# Patient Record
Sex: Female | Born: 1960
Health system: Southern US, Community
[De-identification: ages and names within clinical notes are randomized; demographics above are authoritative.]

## PROBLEM LIST (undated history)

## (undated) DIAGNOSIS — M1990 Unspecified osteoarthritis, unspecified site: Secondary | ICD-10-CM

## (undated) DIAGNOSIS — R87629 Unspecified abnormal cytological findings in specimens from vagina: Secondary | ICD-10-CM

## (undated) DIAGNOSIS — K589 Irritable bowel syndrome without diarrhea: Secondary | ICD-10-CM

## (undated) DIAGNOSIS — D649 Anemia, unspecified: Secondary | ICD-10-CM

## (undated) DIAGNOSIS — K219 Gastro-esophageal reflux disease without esophagitis: Secondary | ICD-10-CM

## (undated) DIAGNOSIS — E785 Hyperlipidemia, unspecified: Secondary | ICD-10-CM

## (undated) HISTORY — PX: TONSILLECTOMY: SUR1361

## (undated) HISTORY — DX: Hyperlipidemia, unspecified: E78.5

## (undated) HISTORY — DX: Irritable bowel syndrome without diarrhea: K58.9

## (undated) HISTORY — DX: Unspecified osteoarthritis, unspecified site: M19.90

## (undated) HISTORY — PX: COLPOSCOPY: SHX161

## (undated) HISTORY — PX: NISSEN FUNDOPLICATION: SHX2091

## (undated) HISTORY — DX: Unspecified abnormal cytological findings in specimens from vagina: R87.629

## (undated) HISTORY — DX: Anemia, unspecified: D64.9

## (undated) HISTORY — DX: Gastro-esophageal reflux disease without esophagitis: K21.9

## (undated) HISTORY — PX: TUBAL LIGATION: SHX77

---

## 2006-10-13 HISTORY — PX: BUNIONECTOMY: SHX129

## 2013-11-28 ENCOUNTER — Other Ambulatory Visit (HOSPITAL_COMMUNITY)
Admission: RE | Admit: 2013-11-28 | Discharge: 2013-11-28 | Disposition: A | Discharge status: Home / Self Care | Payer: 59 | Source: Ambulatory Visit | Attending: Nurse Practitioner | Admitting: Nurse Practitioner

## 2013-11-28 DIAGNOSIS — Z124 Encounter for screening for malignant neoplasm of cervix: Secondary | ICD-10-CM | POA: Insufficient documentation

## 2013-11-28 DIAGNOSIS — R87810 Cervical high risk human papillomavirus (HPV) DNA test positive: Secondary | ICD-10-CM | POA: Insufficient documentation

## 2013-11-28 DIAGNOSIS — Z1151 Encounter for screening for human papillomavirus (HPV): Secondary | ICD-10-CM | POA: Insufficient documentation

## 2015-01-16 ENCOUNTER — Other Ambulatory Visit (HOSPITAL_COMMUNITY)
Admission: RE | Admit: 2015-01-16 | Discharge: 2015-01-16 | Disposition: A | Discharge status: Home / Self Care | Payer: 59 | Source: Ambulatory Visit | Attending: Nurse Practitioner | Admitting: Nurse Practitioner

## 2015-01-16 ENCOUNTER — Other Ambulatory Visit: Payer: Self-pay | Admitting: Nurse Practitioner

## 2015-01-16 DIAGNOSIS — R87810 Cervical high risk human papillomavirus (HPV) DNA test positive: Secondary | ICD-10-CM | POA: Insufficient documentation

## 2015-01-16 DIAGNOSIS — Z1151 Encounter for screening for human papillomavirus (HPV): Secondary | ICD-10-CM | POA: Insufficient documentation

## 2015-01-16 DIAGNOSIS — Z01411 Encounter for gynecological examination (general) (routine) with abnormal findings: Secondary | ICD-10-CM | POA: Insufficient documentation

## 2015-01-17 LAB — CYTOLOGY - PAP

## 2015-03-13 ENCOUNTER — Other Ambulatory Visit: Payer: Self-pay | Admitting: Nurse Practitioner

## 2015-03-14 ENCOUNTER — Encounter: Payer: Self-pay | Admitting: Physical Therapy

## 2015-03-14 ENCOUNTER — Ambulatory Visit: Payer: 59 | Attending: Family Medicine | Admitting: Physical Therapy

## 2015-03-14 DIAGNOSIS — M25551 Pain in right hip: Secondary | ICD-10-CM | POA: Diagnosis not present

## 2015-03-14 DIAGNOSIS — M545 Low back pain, unspecified: Secondary | ICD-10-CM

## 2015-03-14 DIAGNOSIS — M25552 Pain in left hip: Secondary | ICD-10-CM | POA: Diagnosis not present

## 2015-03-14 DIAGNOSIS — M25651 Stiffness of right hip, not elsewhere classified: Secondary | ICD-10-CM | POA: Diagnosis not present

## 2015-03-14 DIAGNOSIS — M25652 Stiffness of left hip, not elsewhere classified: Secondary | ICD-10-CM | POA: Diagnosis not present

## 2015-03-14 NOTE — Therapy (Signed)
[facility] [address], [place], [place], [address] Phone: [phone]   Fax:  [phone]  Physical Therapy Evaluation  Patient Details  Name: Julie Burton MRN: [id number] Date of Birth: Jan 30, 1961 Referring Provider:  Kathyrn Lass, MD  Encounter Date: 03/14/2015      PT End of Session - 03/14/15 1015    Visit Number 1   Date for PT Re-Evaluation 04/25/15   PT Start Time 0932   PT Stop Time 6812   PT Time Calculation (min) 43 min   Activity Tolerance Patient tolerated treatment well   Behavior During Therapy [facility] for tasks assessed/performed      Past Medical History  Diagnosis Date  . Arthritis   . GERD (gastroesophageal reflux disease)     Past Surgical History  Procedure Laterality Date  . Bunionectomy  2008    There were no vitals filed for this visit.  Visit Diagnosis:  Bilateral low back pain without sciatica - Plan: PT plan of care cert/re-cert, CANCELED: PT plan of care cert/re-cert  Bilateral hip pain - Plan: PT plan of care cert/re-cert, CANCELED: PT plan of care cert/re-cert  Stiffness of hip joint, left - Plan: PT plan of care cert/re-cert, CANCELED: PT plan of care cert/re-cert  Stiffness of hip joint, right - Plan: PT plan of care cert/re-cert, CANCELED: PT plan of care cert/re-cert      Subjective Assessment - 03/14/15 0939    Subjective In mid forties, patient began experiencing pain in her low back (spasms). Now the back hurts all the time. Her hips hurt if she is up on her feet a lot.  She is a full time nurse. She has cut back her physical activity significantly outside of work due to pain.    Pertinent History may have fibromyalgia, but couldn't tolerae the meds for it.; family h/o of DJD and back surgeries   Diagnostic tests xrays 2014 didnt show any bony problems in back or hips.   Patient Stated Goals get my core strong enough, decrease pain, get back to walking and hiking and  yard work   Currently in Pain? Yes   Pain Score 3    Pain Location Back   Pain Orientation Lower;Right;Left   Pain Descriptors / Indicators Aching   Pain Type Chronic pain   Pain Radiating Towards bil hips   Pain Onset More than a month ago   Pain Frequency Constant   Aggravating Factors  driving more than an hour   Pain Relieving Factors heat, lying down   Effect of Pain on Daily Activities limitnig gardening, walking    Multiple Pain Sites Yes   Pain Score 3   Pain Location Hip   Pain Orientation Left   Pain Descriptors / Indicators Sharp;Burning   Pain Type Chronic pain   Pain Onset More than a month ago   Pain Frequency Constant   Aggravating Factors  sleeping   Pain Relieving Factors heat voltarin prn   Effect of Pain on Daily Activities limiting walking, gardening            [facility] PT Assessment - 03/14/15 0001    Assessment   Medical Diagnosis LBP; left hip pain   Next MD Visit not scheduled   Prior Therapy none   Precautions   Precautions None   Restrictions   Weight Bearing Restrictions No   Balance Screen   Has the patient fallen in the past 6 months No   Has the patient had  a decrease in activity level because of a fear of falling?  No   Is the patient reluctant to leave their home because of a fear of falling?  No   Prior Function   Level of Independence Independent   Vocation Full time employment   Vocation Requirements nurse   Observation/Other Assessments   Focus on Therapeutic Outcomes (FOTO)  39% limited   ROM / Strength   AROM / PROM / Strength AROM;Strength   AROM   AROM Assessment Site Lumbar   Strength   Strength Assessment Site Hip;Knee   Right/Left Hip Right;Left   Right Hip External Rotation  4   Right Hip Internal Rotation  5   Right Hip ABduction 4+/5   Left Hip Flexion 4+/5   Left Hip External Rotation  4   Left Hip Internal Rotation  4   Right/Left Knee Right;Left   Flexibility   Soft Tissue Assessment /Muscle Length --   tight bil HS, right HF, piriformis, bil ITB   Palpation   Spinal mobility Left L2/3 decreased with mild pain.   Palpation comment right psoas tender   Special Tests    Special Tests Hip Special Tests   Hip Special Tests  Saralyn Pilar (FABER) Test;Ober's Test;SI Distraction;Hip Scouring   Saralyn Pilar (FABER) Test   Findings Positive   Side Right   Ober's Test   Findings Positive   Side Right;Left   SI Distraction   Findings  Negative   Side Right;Left   Hip Scouring   Findings Positive   Side Right                   [facility] Adult PT Treatment/Exercise - 03/14/15 0001    Exercises   Exercises Knee/Hip   Knee/Hip Exercises: Stretches   Active Hamstring Stretch 1 rep;30 seconds   Hip Flexor Stretch 1 rep;30 seconds  kneeling   ITB Stretch 1 rep;30 seconds  with strap   Piriformis Stretch 1 rep;30 seconds  figure 4                PT Education - 03/14/15 1528    Education provided Yes   Education Details HEP ITB, HS, HF and piriformis stretches   Person(s) Educated Patient   Methods Explanation;Demonstration;Handout   Comprehension Verbalized understanding;Returned demonstration          PT Short Term Goals - 03/14/15 1728    PT SHORT TERM GOAL #1   Title I with initial HEP   Time 2   Period Weeks   Status New           PT Long Term Goals - 03/14/15 1729    PT LONG TERM GOAL #1   Title I with advanced Hep   Time 6   Period Weeks   Status New   PT LONG TERM GOAL #2   Title demo improved hip flexibility by 50% or more   Time 6   Period Weeks   Status New   PT LONG TERM GOAL #3   Title report decreased pain in the low back and hips by 50%   Time 6   Period Weeks   Status New   PT LONG TERM GOAL #4   Title report increased activity tolerance by 50%   Time 6   Period Weeks   Status New   PT LONG TERM GOAL #5   Title improved FOTO to 32%   Time 6   Period Weeks   Status New  Plan - 03/14/15 1531    Clinical  Impression Statement 54 year old female with chronic back and hip pain for three years plus. 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 She wold like to return to these activities. She has bil hip weakness and hip tightness and decreased spinal mobility. Hip tightness likely contributing to LB and hip pain.   Pt will benefit from skilled therapeutic intervention in order to improve on the following deficits Decreased range of motion;Increased muscle spasms;Decreased activity tolerance;Pain;Hypomobility;Impaired flexibility;Decreased strength   Rehab Potential Excellent   PT Frequency 2x / week   PT Duration 6 weeks   PT Treatment/Interventions ADLs/Self Care Home Management;Moist Heat;Traction;Therapeutic exercise;Taping;Manual techniques;Cryotherapy;Neuromuscular re-education;Electrical Stimulation;Patient/family education;Passive range of motion   PT Next Visit Plan Review stretches, core strength, functional strengthening   Consulted and Agree with Plan of Care Patient         Problem List There are no active problems to display for this patient.  Almyra Free Rony Ratz PT  03/14/2015, 5:45 PM  [facility] [address] [place], [place], [address] Phone: [phone]   Fax:  [phone]

## 2015-03-14 NOTE — Patient Instructions (Signed)
  Hamstring Stretch, Reclined (Strap, Doorframe)   Lengthen bottom leg on floor. Extend top leg along edge of doorframe or press foot up into yoga strap. Hold for 30____ breaths. Repeat 3____ times each leg.  Piriformis (Supine)  Cross legs, right on top. Gently pull other knee toward chest until stretch is felt in buttock/hip of top leg. Hold ____ seconds. Repeat ____ times per set. Do ____ sets per session. Do ____ sessions per day.    Outer Hip Stretch: Reclined IT Band Stretch (Strap)   Strap around opposite foot, pull across only as far as possible with shoulders on mat. Hold for __30__ secondss. Repeat __3__ times each leg.  Copyright  VHI. All rights reserved.   Flexors, Kneeling   Kneel on one leg. Slowly push pelvis down while slightly arching back until stretch is felt on front of hip. Hold _30__ seconds.  Repeat 3_ times per session. Do _2__ sessions per day.  Copyright  VHI. All rights reserved.   Madelyn Flavors, PT 03/14/2015 10:14 AM [facility] [address] [place], [place] [address] Phone # [phone] Fax [phone]

## 2015-03-16 ENCOUNTER — Ambulatory Visit: Payer: 59 | Admitting: Physical Therapy

## 2015-03-16 DIAGNOSIS — M25651 Stiffness of right hip, not elsewhere classified: Secondary | ICD-10-CM

## 2015-03-16 DIAGNOSIS — M545 Low back pain, unspecified: Secondary | ICD-10-CM

## 2015-03-16 DIAGNOSIS — M25551 Pain in right hip: Secondary | ICD-10-CM

## 2015-03-16 DIAGNOSIS — M25552 Pain in left hip: Secondary | ICD-10-CM

## 2015-03-16 DIAGNOSIS — M25652 Stiffness of left hip, not elsewhere classified: Secondary | ICD-10-CM

## 2015-03-16 NOTE — Therapy (Signed)
[facility] [address] [place], [place], [address] Phone: [phone]   Fax:  [phone]  Physical Therapy Treatment  Patient Details  Name: Julie Burton MRN: [id number] Date of Birth: [birth year] Referring Provider:  Kathyrn Lass, MD  Encounter Date: 03/16/2015      PT End of Session - 03/16/15 0932    Visit Number 2   Date for PT Re-Evaluation 04/25/15   PT Start Time 0931   PT Stop Time 1012   PT Time Calculation (min) 41 min   Activity Tolerance Patient tolerated treatment well      Past Medical History  Diagnosis Date  . Arthritis   . GERD (gastroesophageal reflux disease)     Past Surgical History  Procedure Laterality Date  . Bunionectomy  2008    There were no vitals filed for this visit.  Visit Diagnosis:  Bilateral low back pain without sciatica  Bilateral hip pain  Stiffness of hip joint, left  Stiffness of hip joint, right      Subjective Assessment - 03/16/15 0932    Subjective I'm at baseline today. Did some stretches this morning.   Currently in Pain? Yes   Pain Score 2    Pain Location Back   Pain Orientation Right;Left   Multiple Pain Sites Yes   Pain Score 3   Pain Location Hip   Pain Orientation Left   Pain Descriptors / Indicators Burning                         [facility] Adult PT Treatment/Exercise - 03/16/15 0001    Exercises   Exercises Lumbar   Lumbar Exercises: Stretches   Active Hamstring Stretch 1 rep;30 seconds   ITB Stretch 1 rep;60 seconds  with strap; bil   Piriformis Stretch 1 rep;30 seconds  figure 4, bil   Lumbar Exercises: Machines for Strengthening   Elliptical L1 Incline 7 x 5 min   Lumbar Exercises: Supine   Ab Set 5 reps;5 seconds   Lumbar Exercises: Quadruped   Plank 2 x max hold on elbows; Side plank 1 rep each side   Knee/Hip Exercises: Stretches   Hip Flexor Stretch 1 rep;30 seconds  kneeling, bil added rot too   Knee/Hip Exercises: Sidelying   Clams 5-30 sec hold; then 10 sec holds with green band x                 PT Education - 03/16/15 1021    Education provided Yes   Education Details TrAb set, plank, side plank, clams   Person(s) Educated Patient   Methods Explanation;Demonstration;Handout   Comprehension Verbalized understanding;Returned demonstration          PT Short Term Goals - 03/14/15 1728    PT SHORT TERM GOAL #1   Title I with initial HEP   Time 2   Period Weeks   Status New           PT Long Term Goals - 03/14/15 1729    PT LONG TERM GOAL #1   Title I with advanced Hep   Time 6   Period Weeks   Status New   PT LONG TERM GOAL #2   Title demo improved hip flexibility by 50% or more   Time 6   Period Weeks   Status New   PT LONG TERM GOAL #3   Title report decreased pain in the low back and hips by 50%  Time 6   Period Weeks   Status New   PT LONG TERM GOAL #4   Title report increased activity tolerance by 50%   Time 6   Period Weeks   Status New   PT LONG TERM GOAL #5   Title improved FOTO to 32%   Time 6   Period Weeks   Status New               Plan - 03/16/15 1021    Clinical Impression Statement Paitient at baseline pain today. tolerated core  and hip strenghtening without increased pain.   PT Next Visit Plan continue core and LE strength        Problem List There are no active problems to display for this patient.   Almyra Free Camdon Saetern PT  03/16/2015, 10:23 AM  [facility] [address] [place], [place], [address] Phone: [phone]   Fax:  [phone]

## 2015-03-16 NOTE — Patient Instructions (Signed)
Clam Shell 45 Degrees   Lying with hips and knees bent 45. Place band around thighs. Lift knee. Be sure pelvis does not roll backward. Do not arch back. Hold 10 seconds. Do _10-30__ times, each leg, _1__ times per day.  http://ss.exer.us/75   Copyright  VHI. All rights reserved.   Plank   Support body on hands and feet. Keep hips in line with torso and arms straight under chest. Avoid locking elbows. Hold for 30 seconds. ADVANCED: Extend one leg up.  Copyright  VHI. All rights reserved.   Side Plank  Modification: Do side plank on elbow. From side, press up on one arm and side of foot. Extend other arm up. Hold for   15-30  seconds. Repeat on other side.   Copyright  VHI. All rights reserved.   Lower abdominal/core stability exercises  1. Practice your breathing technique: Inhale through your nose expanding your belly and rib cage. Try not to breathe into your chest. Exhale slowly and gradually out your mouth feeling a sense of softness to your body. Practice multiple times. This can be performed unlimited.  2. Finding the lower abdominals. Laying on your back with the knees bent, place your fingers just below your belly button. Using your breathing technique from above, on your exhale gently pull the belly button away from your fingertips without tensing any other muscles. Practice this 5x. Next, as you exhale, draw belly button inwards and hold onto it...then feel as if you are pulling that muscle across your pelvis like you are tightening a belt. This can be hard to do at first so be patient and practice. Do 5-10 reps 1-3 x day. Always recognize quality over quantity; if your abdominal muscles become tired you will notice you may tighten/contract other muscles. This is the time to take a break.   Practice this first laying on your back, then in sitting, progressing to standing and finally adding it to all your daily movements.   3. Finding your pelvic floor. Using the  breathing technique above, when your exhale, this time draw your pelvic floor muscles up as if you were attempting to stop the flow of urination. Be careful NOT to tense any other muscles. This can be hard, BE PATIENT. Try to hold up to 10 seconds repeating 10x. Try 2x a day. Once you feel you are doing this well, add this contraction to exercise #2. First contracting your pelvic floor followed by lower abdominals.   Julie Burton, PT 03/16/2015 10:09 AM [facility] [address] [place], [place] [address] Phone # [phone] Fax [phone]

## 2015-03-22 ENCOUNTER — Ambulatory Visit: Payer: 59 | Admitting: Physical Therapy

## 2015-03-22 DIAGNOSIS — M25652 Stiffness of left hip, not elsewhere classified: Secondary | ICD-10-CM

## 2015-03-22 DIAGNOSIS — M25551 Pain in right hip: Secondary | ICD-10-CM

## 2015-03-22 DIAGNOSIS — M25651 Stiffness of right hip, not elsewhere classified: Secondary | ICD-10-CM

## 2015-03-22 DIAGNOSIS — M545 Low back pain, unspecified: Secondary | ICD-10-CM

## 2015-03-22 DIAGNOSIS — M25552 Pain in left hip: Secondary | ICD-10-CM

## 2015-03-22 NOTE — Patient Instructions (Signed)
  Quadruped Alternate Hip Extension   Shift weight to one side and raise opposite leg. Keep trunk steady.10_ reps per set, _1-2__ sets per day, _4__ days per week Repeat with other leg.  Bracing With Arm / Leg Raise (Quadruped)  On hands and knees find neutral spine. Tighten pelvic floor and abdominals and hold. Alternating, lift arm to shoulder level and opposite leg to hip level. Repeat 10___ times. Do Do 1-3 sets. 1 times a day.  Lower Abdominals Knee Down / Up, Alternating   Start with both legs up. Lower one leg. Return. Lower other leg. Return. Keep pelvis still. Do 10___ times, alternating legs. Do _3__ sets, _1__ times per day.  http://ss.exer.us/30   Copyright  VHI. All rights reserved.  Lower Abdominals With Heel Slide   Start with both legs up or with feet on ground.  Keep back and pelvis neutral. Press leg away from you with straight leg (leg press). Do _10__ times, each leg. Do _3__ sets, _1__ times per day.  http://ss.exer.us/32   Copyright  VHI. All rights reserved.   Madelyn Flavors, PT 03/22/2015 11:04 AM [facility] [address] [place], [place] [address] Phone # [phone] Fax [phone]

## 2015-03-22 NOTE — Therapy (Signed)
[facility] [address], [place], [place], [address] Phone: [phone]   Fax:  [phone]  Physical Therapy Treatment  Patient Details  Name: Julie Burton MRN: [id number] Date of Birth: [birth year] Referring Provider:  Kathyrn Lass, MD  Encounter Date: 03/22/2015      PT End of Session - 03/22/15 1021    Visit Number 3   Date for PT Re-Evaluation 04/25/15   PT Start Time 1020   PT Stop Time 1105   PT Time Calculation (min) 45 min   Activity Tolerance Patient tolerated treatment well      Past Medical History  Diagnosis Date  . Arthritis   . GERD (gastroesophageal reflux disease)     Past Surgical History  Procedure Laterality Date  . Bunionectomy  2008    There were no vitals filed for this visit.  Visit Diagnosis:  Bilateral low back pain without sciatica  Bilateral hip pain  Stiffness of hip joint, right  Stiffness of hip joint, left      Subjective Assessment - 03/22/15 1024    Subjective Hips have eased up a lot (30% improved flexibility). LB is somewhat better. By end of 12 hour shift  3-4/10 in low back and hips.   Currently in Pain? No/denies  pressure in LB, no pain                         [facility] Adult PT Treatment/Exercise - 03/22/15 0001    Lumbar Exercises: Supine   Ab Set 15 reps  progression of TrAb; hookying toe taps; alt leg press   Other Supine Lumbar Exercises hip add stretch with strap 1x30 sec bil   Lumbar Exercises: Quadruped   Straight Leg Raise 10 reps   Opposite Arm/Leg Raise 15 reps   Knee/Hip Exercises: Stretches   Active Hamstring Stretch 1 rep;30 seconds   Hip Flexor Stretch 1 rep;30 seconds  modified in stdg with ab set   ITB Stretch 1 rep;60 seconds  with strap; bil; also standing x 1 rep   Piriformis Stretch 1 rep;30 seconds  figure 4, bil   Knee/Hip Exercises: Aerobic   Elliptical L2 Incline 7 x 6 min                PT  Education - 03/22/15 1128    Education provided Yes   Education Details HEP: abs alt leg press and toe taps from table top position; quad alt leg and opp arm/leg   Person(s) Educated Patient   Methods Explanation;Demonstration;Handout;Tactile cues   Comprehension Verbalized understanding;Returned demonstration          PT Short Term Goals - 03/22/15 1107    PT SHORT TERM GOAL #1   Title I with initial HEP   Time 2   Period Weeks   Status Achieved           PT Long Term Goals - 03/22/15 1107    PT LONG TERM GOAL #1   Title I with advanced Hep   Time 6   Period Weeks   Status On-going   PT LONG TERM GOAL #2   Title demo improved hip flexibility by 50% or more  30% improvement   Time 6   Period Weeks   Status On-going   PT LONG TERM GOAL #3   Title report decreased pain in the low back and hips by 50%   Time 6   Period Weeks   Status  On-going               Plan - 03/22/15 1129    Clinical Impression Statement Patient is progressing with goals. She reports 30% improvement of hip flexibility and decreased pain in hips and low back. She is I with her initial HEP. She continues to have clicking in her low back with some abdominal exercises and still if tight in the hips although improving.   PT Next Visit Plan continue core and LE strength and flexibility   Consulted and Agree with Plan of Care Patient        Problem List There are no active problems to display for this patient.   Almyra Free Kashari Chalmers PT  03/22/2015, 11:32 AM  [facility] [address], [place], [place], [address] Phone: [phone]   Fax:  [phone]

## 2015-03-29 ENCOUNTER — Telehealth: Payer: Self-pay | Admitting: Physical Therapy

## 2015-03-29 ENCOUNTER — Ambulatory Visit: Payer: 59 | Admitting: Physical Therapy

## 2015-03-29 NOTE — Telephone Encounter (Signed)
Talked with pt she completely forgot, she is aware of her appointment on Monday June 20th at 11:00am

## 2015-04-02 ENCOUNTER — Encounter: Payer: Self-pay | Admitting: Physical Therapy

## 2015-04-02 ENCOUNTER — Ambulatory Visit: Payer: 59 | Admitting: Physical Therapy

## 2015-04-02 DIAGNOSIS — M545 Low back pain, unspecified: Secondary | ICD-10-CM

## 2015-04-02 DIAGNOSIS — M25552 Pain in left hip: Secondary | ICD-10-CM

## 2015-04-02 DIAGNOSIS — M25651 Stiffness of right hip, not elsewhere classified: Secondary | ICD-10-CM

## 2015-04-02 DIAGNOSIS — M25551 Pain in right hip: Secondary | ICD-10-CM

## 2015-04-02 DIAGNOSIS — M25652 Stiffness of left hip, not elsewhere classified: Secondary | ICD-10-CM

## 2015-04-02 NOTE — Therapy (Signed)
[facility] [address] [place], [place], [address] Phone: [phone]   Fax:  [phone]  Physical Therapy Treatment  Patient Details  Name: Julie Burton MRN: [id number] Date of Birth: 18-Apr-1961 Referring Provider:  Kathyrn Lass, MD  Encounter Date: 04/02/2015      PT End of Session - 04/02/15 1435    Visit Number 4   Date for PT Re-Evaluation 04/25/15   PT Start Time 1103   PT Stop Time 1145   PT Time Calculation (min) 42 min   Activity Tolerance Patient tolerated treatment well   Behavior During Therapy [facility] for tasks assessed/performed      Past Medical History  Diagnosis Date  . Arthritis   . GERD (gastroesophageal reflux disease)     Past Surgical History  Procedure Laterality Date  . Bunionectomy  2008    There were no vitals filed for this visit.  Visit Diagnosis:  Bilateral low back pain without sciatica  Bilateral hip pain  Stiffness of hip joint, right  Stiffness of hip joint, left      Subjective Assessment - 04/02/15 1115    Subjective pt reports had a bad week last week,, improvement rated as 25% this week. LBP  after working is 4/10 and Lt hip pain is described at sharp   Pertinent History may have fibromyalgia, but couldn't tolerae the meds for it.; family h/o of DJD and back surgeries   Diagnostic tests xrays 2014 didnt show any bony problems in back or hips.   Patient Stated Goals get my core strong enough, decrease pain, get back to walking and hiking and yard work   Currently in Pain? No/denies   Pain Onset More than a month ago   Multiple Pain Sites Yes   Pain Score 3  up to 7-8/10   Pain Location Hip   Pain Orientation Left   Pain Descriptors / Indicators Burning;Sharp   Pain Type Chronic pain   Pain Onset More than a month ago   Pain Frequency Constant                         [facility] Adult PT Treatment/Exercise - 04/02/15 0001    Exercises   Exercises Lumbar   Lumbar Exercises: Stretches   Active Hamstring Stretch 1 rep;30 seconds   ITB Stretch 3 reps;20 seconds  with gentle overpressure by PTA   Piriformis Stretch 3 reps;20 seconds  in sitting and supine   Lumbar Exercises: Machines for Strengthening   Elliptical L5 Incline 7 x 6 min   Lumbar Exercises: Supine   Ab Set 5 reps;Other (comment)  TA activation, marching, leg lenthener   Lumbar Exercises: Quadruped   Plank 3 x 20 sec hold   Knee/Hip Exercises: Stretches   Hip Flexor Stretch 3 reps;20 seconds   Knee/Hip Exercises: Sidelying   Clams 3 x 10 with green t-band                  PT Short Term Goals - 04/02/15 1432    PT SHORT TERM GOAL #1   Title I with initial HEP   Time 2   Period Weeks   Status Achieved           PT Long Term Goals - 04/02/15 1433    PT LONG TERM GOAL #1   Title I with advanced Hep   Time 6   Period Weeks   Status On-going   PT  LONG TERM GOAL #2   Title demo improved hip flexibility by 50% or more   Time 6   Period Weeks   Status On-going   PT LONG TERM GOAL #3   Title report decreased pain in the low back and hips by 50%   Time 6   Period Weeks   Status On-going   PT LONG TERM GOAL #4   Title report increased activity tolerance by 50%   Time 6   Period Weeks   Status On-going   PT LONG TERM GOAL #5   Title improved FOTO to 32%   Time 6   Period Weeks   Status On-going               Plan - 04/02/15 1431    Clinical Impression Statement Pt is progressing wtoward goals. She reports thsi week only 25% improvement rated since she had a flare up last week.   Pt will benefit from skilled therapeutic intervention in order to improve on the following deficits Decreased range of motion;Increased muscle spasms;Decreased activity tolerance;Pain;Hypomobility;Impaired flexibility;Decreased strength   Rehab Potential Excellent   PT Frequency 2x / week   PT Duration 6 weeks   PT Treatment/Interventions  ADLs/Self Care Home Management;Moist Heat;Traction;Therapeutic exercise;Taping;Manual techniques;Cryotherapy;Neuromuscular re-education;Electrical Stimulation;Patient/family education;Passive range of motion   PT Next Visit Plan continue core and LE strength and flexibility   Consulted and Agree with Plan of Care Patient        Problem List There are no active problems to display for this patient.   NAUMANN-HOUEGNIFIO,Hartlyn Reigel PTA 04/02/2015, 2:36 PM  [facility] [address] [place], [place], [address] Phone: [phone]   Fax:  [phone]

## 2015-04-06 ENCOUNTER — Ambulatory Visit: Payer: 59 | Admitting: Physical Therapy

## 2015-04-23 ENCOUNTER — Ambulatory Visit: Payer: 59 | Attending: Family Medicine | Admitting: Physical Therapy

## 2015-04-23 ENCOUNTER — Encounter: Payer: Self-pay | Admitting: Physical Therapy

## 2015-04-23 DIAGNOSIS — M25652 Stiffness of left hip, not elsewhere classified: Secondary | ICD-10-CM | POA: Insufficient documentation

## 2015-04-23 DIAGNOSIS — M25651 Stiffness of right hip, not elsewhere classified: Secondary | ICD-10-CM | POA: Insufficient documentation

## 2015-04-23 DIAGNOSIS — M545 Low back pain, unspecified: Secondary | ICD-10-CM

## 2015-04-23 DIAGNOSIS — M25551 Pain in right hip: Secondary | ICD-10-CM | POA: Insufficient documentation

## 2015-04-23 DIAGNOSIS — M25552 Pain in left hip: Secondary | ICD-10-CM | POA: Insufficient documentation

## 2015-04-23 NOTE — Therapy (Signed)
[facility] [address] [place], [place], [address] Phone: [phone]   Fax:  [phone]  Physical Therapy Treatment  Patient Details  Name: Julie Burton MRN: [id number] Date of Birth: 1961-05-06 Referring Provider:  Kathyrn Lass, MD  Encounter Date: 04/23/2015      PT End of Session - 04/23/15 0947    Visit Number 5   Date for PT Re-Evaluation 04/25/15   PT Start Time 0940   PT Stop Time 1015   PT Time Calculation (min) 35 min   Activity Tolerance Patient tolerated treatment well   Behavior During Therapy [facility] for tasks assessed/performed      Past Medical History  Diagnosis Date  . Arthritis   . GERD (gastroesophageal reflux disease)     Past Surgical History  Procedure Laterality Date  . Bunionectomy  2008    There were no vitals filed for this visit.  Visit Diagnosis:  Bilateral low back pain without sciatica  Bilateral hip pain  Stiffness of hip joint, right  Stiffness of hip joint, left      Subjective Assessment - 04/23/15 0942    Subjective Pt was traveling last week, the 5 hour travel in the plane to [place] caused pain in the Lt hip no pain in the back All over pt feels improvement and wishes to be D/C she is aware to start a gym program to continue to work oin strength and flexibility   Currently in Pain? Yes   Pain Score 3    Pain Location Back   Pain Orientation Right;Left   Pain Descriptors / Indicators Aching;Tightness   Pain Type Chronic pain   Pain Onset More than a month ago   Pain Frequency Constant   Multiple Pain Sites Yes   Pain Score 2   Pain Location Hip   Pain Orientation Right;Left   Pain Descriptors / Indicators Burning   Pain Type Chronic pain   Pain Onset More than a month ago   Pain Frequency Constant            [facility] PT Assessment - 04/23/15 0001    Assessment   Medical Diagnosis LBP; left hip pain   Next MD Visit not scheduled   Prior Therapy  none   Precautions   Precautions None   Restrictions   Weight Bearing Restrictions No   Balance Screen   Has the patient fallen in the past 6 months No   Has the patient had a decrease in activity level because of a fear of falling?  No   Is the patient reluctant to leave their home because of a fear of falling?  No   Prior Function   Level of Independence Independent   Vocation Full time employment   Vocation Requirements nurse   Observation/Other Assessments   Focus on Therapeutic Outcomes (FOTO)  29%   Strength   Strength Assessment Site Hip;Knee   Right/Left Hip Right;Left   Right Hip External Rotation  5   Right Hip Internal Rotation  5   Right Hip ABduction 4+/5   Left Hip Flexion 5/5   Left Hip Extension 5/5   Left Hip External Rotation  5   Left Hip Internal Rotation  5   Right/Left Knee Right;Left                     [facility] Adult PT Treatment/Exercise - 04/23/15 0001    Exercises   Exercises Lumbar   Lumbar Exercises:  Stretches   ITB Stretch 3 reps;20 seconds   Piriformis Stretch 3 reps;20 seconds  in sitting and supine   Lumbar Exercises: Machines for Strengthening   Elliptical L5 Incline 7 x 6 min   Stationary Bike L2 x 48min   Lumbar Exercises: Quadruped   Plank 3 x 20 sec hold                  PT Short Term Goals - 04/23/15 7026    PT SHORT TERM GOAL #1   Title I with initial HEP   Time 2   Period Weeks   Status Achieved           PT Long Term Goals - 04/23/15 3785    PT LONG TERM GOAL #1   Title I with advanced Hep   Time 6   Period Weeks   Status Achieved   PT LONG TERM GOAL #2   Title demo improved hip flexibility by 50% or more   Time 6   Period Weeks   Status Achieved   PT LONG TERM GOAL #3   Title report decreased pain in the low back and hips by 50%   Time 6   Period Weeks   Status Achieved   PT LONG TERM GOAL #4   Title report increased activity tolerance by 50%   Time 6   Period Weeks   Status  Achieved   PT LONG TERM GOAL #5   Title improved FOTO to 32%   Time 6   Period Weeks   Status Achieved  29% limitation               Plan - 04/23/15 0949    Clinical Impression Statement Pt with good demo of stretching exercises.   Pt will benefit from skilled therapeutic intervention in order to improve on the following deficits Decreased range of motion;Increased muscle spasms;Decreased activity tolerance;Pain;Hypomobility;Impaired flexibility;Decreased strength   Rehab Potential --   PT Frequency --   PT Duration --   PT Treatment/Interventions ADLs/Self Care Home Management;Moist Heat;Traction;Therapeutic exercise;Taping;Manual techniques;Cryotherapy;Neuromuscular re-education;Electrical Stimulation;Patient/family education;Passive range of motion   PT Next Visit Plan D/C PT to HEP   Consulted and Agree with Plan of Care Patient        Problem List There are no active problems to display for this patient.  Julie Burton, PTA 04/23/2015 1:41 PM  TAKACS,KELLY PT 04/23/2015, 10:30 AM  [facility] [address], [place], [place], [address] Phone: [phone]   Fax:  [phone]

## 2015-08-01 ENCOUNTER — Ambulatory Visit (INDEPENDENT_AMBULATORY_CARE_PROVIDER_SITE_OTHER): Payer: 59

## 2015-08-01 ENCOUNTER — Encounter: Payer: Self-pay | Admitting: Family Medicine

## 2015-08-01 ENCOUNTER — Ambulatory Visit (INDEPENDENT_AMBULATORY_CARE_PROVIDER_SITE_OTHER): Payer: 59 | Admitting: Family Medicine

## 2015-08-01 VITALS — BP 110/72 | HR 67 | Ht 61.0 in | Wt 136.0 lb

## 2015-08-01 DIAGNOSIS — G8929 Other chronic pain: Secondary | ICD-10-CM

## 2015-08-01 DIAGNOSIS — M545 Low back pain, unspecified: Secondary | ICD-10-CM | POA: Insufficient documentation

## 2015-08-01 MED ORDER — AMITRIPTYLINE HCL 25 MG PO TABS: 25.0000 mg | ORAL_TABLET | Freq: Every day | ORAL | Status: DC

## 2015-08-01 NOTE — Progress Notes (Signed)
Julie Burton is a 54 y.o. female who presents to [facility] [place]  today for low back pain. Patient has a one-year history of bilateral low back pain left worse than right. She notes the pain radiates to her buttocks and lateral thighs. She denies any pain radiating below the knee weakness numbness bowel bladder dysfunction. The pain is worse in the morning and worse after a long day of work. The pain limits her ability to exercise and enjoy life. She denies any fevers chills nausea vomiting or diarrhea. She has not had imaging but has had some physical therapy in July for this problem. Additionally she's had naproxen and diclofenac gel which helped a little. Physical therapy helped a little as well. 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 She denies any specific injury that may have caused the pain.   Past Medical History  Diagnosis Date  . Arthritis   . GERD (gastroesophageal reflux disease)    Past Surgical History  Procedure Laterality Date  . Bunionectomy  2008  . Tubal ligation    . Tonsillectomy    . Cesarean section     Social History  Substance Use Topics  . Smoking status: Never Smoker   . Smokeless tobacco: Not on file  . Alcohol Use: No   family history includes COPD in her mother; Cancer in her mother; Depression in her mother; Diabetes in her mother; Heart disease in her mother and paternal grandfather; Hyperlipidemia in her mother; Hypertension in her mother; Miscarriages / Stillbirths in her paternal grandmother.  ROS as above Medications: Current Outpatient Prescriptions  Medication Sig Dispense Refill  . CycloSPORINE (RESTASIS OP) Apply to eye.    Marland Kitchen KRILL OIL PO Take by mouth.    . Naproxen Sodium (ALEVE PO) Take by mouth.    Marland Kitchen amitriptyline (ELAVIL) 25 MG tablet Take 1 tablet (25 mg total) by mouth at bedtime. 90 tablet 0   No current facility-administered medications for this visit.   Not on File   Exam:  BP 110/72  mmHg  Pulse 67  Ht 5\' 1"  (1.549 m)  Wt 136 lb (61.689 kg)  BMI 25.71 kg/m2 Gen: Well NAD HEENT: EOMI,  MMM Lungs: Normal work of breathing. CTABL Heart: RRR no MRG Abd: NABS, Soft. Nondistended, Nontender Exts: Brisk capillary refill, warm and well perfused.  Back: Nontender to midline. Mildly tender to palpation left SI nontender right. Back range of motion is normal with flexion and extension and rotation. Pain with left rotation. Negative stork's test. Negative straight leg raise test bilaterally. Positive left-sided pretzel stretch negative right. Negative Faber test bilaterally. Marginally strength is intact. Reflexes are intact and equal bilaterally. Normal gait.  No results found for this or any previous visit (from the past 24 hour(s)). No results found.   Please see individual assessment and plan sections.

## 2015-08-01 NOTE — Progress Notes (Signed)
Quick Note:  Xrays are pretty normal with a mild arthritis. ______

## 2015-08-01 NOTE — Assessment & Plan Note (Signed)
Lumbar pain very likely related to SI joint dysfunction. I'll suspect she has degree of degenerative joint disease in her lumbar spine. Plan for lumbar and pelvis x-ray, referral to physical therapy, and trial of amitriptyline. Recheck in a few weeks. If pain continues would recommend a diagnostic and therapeutic left SI joint injection.

## 2015-08-01 NOTE — Patient Instructions (Signed)
Thank you for coming in today. Take amitriptyline at bedtime for pain. Attended physical therapy. Continue naproxen for pain as needed. Get x-rays today. Return in 3 weeks or so.  Come back or go to the emergency room if you notice new weakness new numbness problems walking or bowel or bladder problems.

## 2015-08-07 ENCOUNTER — Encounter: Payer: Self-pay | Admitting: Physical Therapy

## 2015-08-07 ENCOUNTER — Ambulatory Visit (INDEPENDENT_AMBULATORY_CARE_PROVIDER_SITE_OTHER): Payer: 59 | Admitting: Physical Therapy

## 2015-08-07 DIAGNOSIS — R531 Weakness: Secondary | ICD-10-CM

## 2015-08-07 DIAGNOSIS — M545 Low back pain, unspecified: Secondary | ICD-10-CM

## 2015-08-07 NOTE — Patient Instructions (Signed)
Pelvic Press      [place] [phone]    Place hands under belly between navel and pubic bone, palms up. Feel pressure on hands. Increase pressure on hands by pressing pelvis down. This is NOT a pelvic tilt. Hold __5_ seconds. Relax. Once a day.  Repeat _10__ times.  Leg Lift: One-Leg    Press pelvis down. Keep knee straight; lengthen and lift one leg (from waist). Do not twist body. Keep other leg down. Hold 1___ seconds. Relax. Repeat 10 time. Repeat with other leg. Once a day  Self-Mobilization: Knee Flexion (Prone)    Bring left heel toward buttocks as close as possible then maintaining pelvic press, lift thigh. Hold _1___ seconds. Relax. Repeat _10___ times per set. Do _1___ sets per session. Repeat on the right leg.  Do __1__ sessions per day.  Cat / Cow Flow    Inhale, press spine toward ceiling like a Halloween cat. Keeping strength in arms and abdominals, exhale to soften spine through neutral and into cow pose. Open chest and arch back. Initiate movement between cat and cow at tailbone, one vertebrae at a time. Repeat _10___ times. Once a day  Outer Hip Stretch: Reclined IT Band Stretch (Strap)    Strap around opposite foot, pull across only as far as possible with shoulders on mat. Hold for _10___ breaths. Repeat _2___ times each leg. Once a day.   Copyright  VHI. All rights reserved.

## 2015-08-07 NOTE — Therapy (Signed)
[place], [place], [address] Phone: [phone]   Fax:  [phone]  Physical Therapy Evaluation  Patient Details  Name: Julie Burton MRN: [id number] Date of Birth: 1960/12/29 Referring Provider: Dr Georgina Snell  Encounter Date: 08/07/2015      PT End of Session - 08/07/15 0936    Visit Number 1   Number of Visits 6   Date for PT Re-Evaluation 08/28/15   PT Start Time 0850   PT Stop Time 0936   PT Time Calculation (min) 46 min   Activity Tolerance Patient tolerated treatment well  pain down some after treatment      Past Medical History  Diagnosis Date  . Arthritis   . GERD (gastroesophageal reflux disease)     Past Surgical History  Procedure Laterality Date  . Bunionectomy  2008  . Tubal ligation    . Tonsillectomy    . Cesarean section      There were no vitals filed for this visit.  Visit Diagnosis:  Weakness generalized - Plan: PT plan of care cert/re-cert  Bilateral low back pain without sciatica - Plan: PT plan of care cert/re-cert      Subjective Assessment - 08/07/15 0857    Subjective Pt reports she had some PT in July and was issued a lot of stretches. She is still doing these.  Reports she doesnt' feel " better how ever it is bearable.    Currently in Pain? Yes   Pain Score 3    Pain Location Back  sacrum   Pain Orientation Right;Left   Pain Descriptors / Indicators Aching;Tightness  has an area that is very sensative to touch - burning with light touch    Pain Type Chronic pain   Pain Radiating Towards to bilat hips/greater troch   Pain Onset More than a month ago   Pain Frequency Constant   Aggravating Factors  lying in bed, prolonged sitting. charting at work. Walking on uneven surfaces.    Pain Relieving Factors heat            [facility] PT Assessment - 08/07/15 0001    Assessment   Medical Diagnosis Chronic LBP without sciatica   Referring Provider Dr  Georgina Snell   Onset Date/Surgical Date 08/06/13   Prior Therapy Yes in July of this year   Precautions   Precautions None   Restrictions   Weight Bearing Restrictions No   Balance Screen   Has the patient fallen in the past 6 months No   Has the patient had a decrease in activity level because of a fear of falling?  No   Is the patient reluctant to leave their home because of a fear of falling?  No   Prior Function   Level of Independence Independent   Vocation Full time employment   Engineer, mining, assist patients,    Leisure hiking, be active outside without pain, play with grandchildren   Observation/Other Assessments   Focus on Therapeutic Outcomes (FOTO)  37% limited   AROM   AROM Assessment Site Lumbar   Lumbar Flexion WNL with stretching   Lumbar Extension WNL   Lumbar - Right Side Bend WNL   Lumbar - Left Side Bend decreased 25% with pain   Lumbar - Right Rotation WNL   Lumbar - Left Rotation WNL   Strength   Overall Strength --  ankles/knees WNL   Strength Assessment Site Lumbar   Right/Left Hip Right;Left   Right  Hip Flexion 5/5   Right Hip Extension 4/5   Right Hip ABduction 5/5   Left Hip Flexion 5/5   Left Hip Extension 4+/5   Left Hip ABduction 5/5   Lumbar Flexion --  Lt good (-) Rt fair    Lumbar Extension --  Lt fair (+), Rt good (-)    Palpation   Spinal mobility hypomobile in lumbar spine    Palpation comment tight in bilat upper lumbar paraspinals and Lt lower.    SI Distraction   Findings  Negative   Hip Scouring   Findings Negative                   [facility] Adult PT Treatment/Exercise - 08/07/15 0001    Lumbar Exercises: Stretches   ITB Stretch --  cross body with strap 2x10 breathes   Lumbar Exercises: Prone   Other Prone Lumbar Exercises pelivc press, then with SLR and bend knee SLR   Lumbar Exercises: Quadruped   Madcat/Old Horse 10 reps                PT Education - 08/07/15 0931    Education provided Yes    Education Details HEP    Person(s) Educated Patient   Methods Explanation;Handout   Comprehension Returned demonstration;Verbalized understanding          PT Short Term Goals - 04/23/15 0952    PT SHORT TERM GOAL #1   Title I with initial HEP   Time 2   Period Weeks   Status Achieved           PT Long Term Goals - 08/07/15 1007    PT LONG TERM GOAL #1   Title i with advanced HEP ( 08/28/15)   Time 3   Period Weeks   Status New   PT LONG TERM GOAL #2   Title perform  core ther ex with good contraction of TA and multifidi ( 08/28/15)   Time 3   Period Weeks   Status New   PT LONG TERM GOAL #3   Title report pain decrease =/> 50% overall ( 08/28/15)   Time 3   Period Weeks   Status New   PT LONG TERM GOAL #4   Title improve FOTO =/< 32% limited ( 08/28/15)               Plan - 08/07/15 1020    Clinical Impression Statement Pt returning to PT to see if she can have more relief from her back pain.  She has been performing some of the stretches from the previous treatment this summer.  Patient has some tightness in her lumbar paraspinals, instability and core weakness and would benefit from a stability exercise program.    Pt will benefit from skilled therapeutic intervention in order to improve on the following deficits Decreased strength;Pain;Increased muscle spasms   Rehab Potential Good   PT Frequency 2x / week   PT Duration 3 weeks   PT Treatment/Interventions ADLs/Self Care Home Management;Moist Heat;Traction;Therapeutic exercise;Taping;Manual techniques;Cryotherapy;Neuromuscular re-education;Electrical Stimulation;Patient/family education;Passive range of motion;Dry needling   PT Next Visit Plan See pt until she follows up with her MD in 3 weeks.    Consulted and Agree with Plan of Care Patient         Problem List Patient Active Problem List   Diagnosis Date Noted  . Lumbago 08/01/2015    Jeral Pinch PT 08/07/2015, 10:23 AM  [facility] [address]  [place], [place], [address] Phone: [phone]   Fax:  [phone]  Name: Julie Burton MRN: [id number] Date of Birth: [birth year]

## 2015-08-09 ENCOUNTER — Ambulatory Visit (INDEPENDENT_AMBULATORY_CARE_PROVIDER_SITE_OTHER): Payer: 59 | Admitting: Physical Therapy

## 2015-08-09 DIAGNOSIS — R531 Weakness: Secondary | ICD-10-CM | POA: Diagnosis not present

## 2015-08-09 DIAGNOSIS — M25551 Pain in right hip: Secondary | ICD-10-CM | POA: Diagnosis not present

## 2015-08-09 DIAGNOSIS — M545 Low back pain, unspecified: Secondary | ICD-10-CM

## 2015-08-09 DIAGNOSIS — M25651 Stiffness of right hip, not elsewhere classified: Secondary | ICD-10-CM | POA: Diagnosis not present

## 2015-08-09 DIAGNOSIS — M25652 Stiffness of left hip, not elsewhere classified: Secondary | ICD-10-CM

## 2015-08-09 DIAGNOSIS — M25552 Pain in left hip: Secondary | ICD-10-CM

## 2015-08-09 NOTE — Therapy (Signed)
[place], [place], [address] Phone: [phone]   Fax:  [phone]  Physical Therapy Treatment  Patient Details  Name: Julie Burton MRN: [id number] Date of Birth: [birth year] Referring Provider: Dr Georgina Snell  Encounter Date: 08/09/2015      PT End of Session - 08/09/15 0937    Visit Number 2   Number of Visits 6   Date for PT Re-Evaluation 08/28/15   PT Start Time 0936   PT Stop Time 1033   PT Time Calculation (min) 57 min      Past Medical History  Diagnosis Date  . Arthritis   . GERD (gastroesophageal reflux disease)     Past Surgical History  Procedure Laterality Date  . Bunionectomy  2008  . Tubal ligation    . Tonsillectomy    . Cesarean section      There were no vitals filed for this visit.  Visit Diagnosis:  Weakness generalized  Bilateral low back pain without sciatica  Bilateral hip pain  Stiffness of hip joint, right  Stiffness of hip joint, left      Subjective Assessment - 08/09/15 0937    Currently in Pain? Yes   Pain Score 3    Pain Location Back  and lateral hips   Aggravating Factors  positional.    Pain Relieving Factors heat           [facility] Adult PT Treatment/Exercise - 08/09/15 0001    Exercises   Exercises Knee/Hip;Lumbar   Lumbar Exercises: Stretches   Passive Hamstring Stretch 30 seconds;3 reps  each leg   Hip Flexor Stretch 3 reps;30 seconds  knee to chest, opp leg off edge of table   Piriformis Stretch 2 reps;30 seconds  each leg   Lumbar Exercises: Aerobic   Stationary Bike NuStep L4: 5 min    Lumbar Exercises: Supine   Ab Set 10 reps;5 seconds   AB Set Limitations VC/ tactile cues    Modalities   Modalities Electrical Stimulation;Moist Heat   Moist Heat Therapy   Number Minutes Moist Heat 15 Minutes   Moist Heat Location Lumbar Spine   Electrical Stimulation   Electrical Stimulation Location Bilat lumbar paraspinals, piriformis    Electrical Stimulation Action IFC   Electrical Stimulation Parameters to tolerance    Electrical Stimulation Goals Pain   Manual Therapy   Manual Therapy Myofascial release;Soft tissue mobilization   Soft tissue mobilization to Rt/Lt glutes, hip rotators    Myofascial Release to Rt/ Lt psoas, Rt/ Lt QL.            PT Education - 08/09/15 1141    Education provided Yes   Education Details Pt issued information on TENS unit.    Person(s) Educated Patient   Methods Explanation;Handout   Comprehension Verbalized understanding             PT Long Term Goals - 08/07/15 1007    PT LONG TERM GOAL #1   Title i with advanced HEP ( 08/28/15)   Time 3   Period Weeks   Status New   PT LONG TERM GOAL #2   Title perform  core ther ex with good contraction of TA and multifidi ( 08/28/15)   Time 3   Period Weeks   Status New   PT LONG TERM GOAL #3   Title report pain decrease =/> 50% overall ( 08/28/15)   Time 3   Period Weeks   Status New  PT LONG TERM GOAL #4   Title improve FOTO =/< 32% limited ( 08/28/15)               Plan - 08/09/15 1024    Clinical Impression Statement Pt tolerated exercises well with noted tightness in RLE.  Point tender in bilateral glutes Lt > Rt, tightness in Lt QL. Slight increase in pain during manual therapy, reduced with use of estim and MHP at end of session.  No goals met, only 2nd session.     Pt will benefit from skilled therapeutic intervention in order to improve on the following deficits Decreased strength;Pain;Increased muscle spasms   Rehab Potential Good   PT Frequency 2x / week   PT Duration 3 weeks   PT Treatment/Interventions ADLs/Self Care Home Management;Moist Heat;Traction;Therapeutic exercise;Taping;Manual techniques;Cryotherapy;Neuromuscular re-education;Electrical Stimulation;Patient/family education;Passive range of motion;Dry needling   PT Next Visit Plan Continue manual therapy to pelvis stabilizers/ LB to improve  imbalance.  Continue core strengthening exercises and postural awareness eduction (related to work).    Consulted and Agree with Plan of Care Patient        Problem List Patient Active Problem List   Diagnosis Date Noted  . Lumbago 08/01/2015   Kerin Perna, PTA 08/09/2015 11:46 AM  [facility] [place], [place], [address] Phone: [phone]   Fax:  [phone]  Name: Julie Burton MRN: [id number] Date of Birth: 22-Jul-1961

## 2015-08-22 ENCOUNTER — Encounter: Payer: Self-pay | Admitting: Family Medicine

## 2015-08-22 ENCOUNTER — Ambulatory Visit (INDEPENDENT_AMBULATORY_CARE_PROVIDER_SITE_OTHER): Payer: 59 | Admitting: Family Medicine

## 2015-08-22 VITALS — BP 128/61 | HR 84 | Wt 137.0 lb

## 2015-08-22 DIAGNOSIS — M545 Low back pain: Secondary | ICD-10-CM

## 2015-08-22 DIAGNOSIS — G8929 Other chronic pain: Secondary | ICD-10-CM | POA: Diagnosis not present

## 2015-08-22 MED ORDER — DULOXETINE HCL 30 MG PO CPEP: 30.0000 mg | ORAL_CAPSULE | Freq: Every day | ORAL | Status: DC

## 2015-08-22 NOTE — Progress Notes (Signed)
Julie Burton is a 54 y.o. female who presents to [facility]  today for chronic back pain. Patient was seen a few weeks ago for chronic back pain. She was given amitriptyline. Amitriptyline helped but she cannot take it because it causes excessive next day sedation. Additionally she's had physical therapy which has helped a bit. She continues to experience pain in her bilateral low back and into the buttocks. She denies any radiating pain weakness or numbness. The pain is worse when she gets up in the morning improves with the day that is worse at the end of the day. She denies any injury. No fevers chills nausea vomiting or diarrhea.   Past Medical History  Diagnosis Date  . Arthritis   . GERD (gastroesophageal reflux disease)    Past Surgical History  Procedure Laterality Date  . Bunionectomy  2008  . Tubal ligation    . Tonsillectomy    . Cesarean section     Social History  Substance Use Topics  . Smoking status: Never Smoker   . Smokeless tobacco: Not on file  . Alcohol Use: No   family history includes COPD in her mother; Cancer in her mother; Depression in her mother; Diabetes in her mother; Heart disease in her mother and paternal grandfather; Hyperlipidemia in her mother; Hypertension in her mother; Miscarriages / Stillbirths in her paternal grandmother.  ROS as above Medications: Current Outpatient Prescriptions  Medication Sig Dispense Refill  . CycloSPORINE (RESTASIS OP) Apply to eye.    Marland Kitchen KRILL OIL PO Take by mouth.    . Naproxen Sodium (ALEVE PO) Take by mouth.    . DULoxetine (CYMBALTA) 30 MG capsule Take 1 capsule (30 mg total) by mouth daily. 30 capsule 0   No current facility-administered medications for this visit.   Not on File   Exam:  BP 128/61 mmHg  Pulse 84  Wt 137 lb (62.143 kg) Gen: Well NAD HEENT: EOMI,  MMM Lungs: Normal work of breathing. CTABL Heart: RRR no MRG Abd: NABS, Soft. Nondistended,  Nontender Exts: Brisk capillary refill, warm and well perfused.  Back: Nontender to midline. Mildly tender palpation bilateral lumbar paraspinals. Tender palpation left SI joint. Normal back range of motion. Normal gait.  X-ray L-spine and pelvis 08/01/2015 reviewed  No results found for this or any previous visit (from the past 24 hour(s)). No results found.   Please see individual assessment and plan sections.

## 2015-08-22 NOTE — Patient Instructions (Signed)
Thank you for coming in today. Continue PT.  Start Cymbalta 30 mg daily.  Return in a few weeks.   Consider a diagnostic and therapeutic SI injection.

## 2015-08-22 NOTE — Assessment & Plan Note (Signed)
Multifactorial cause of back pain. She does have some DDD and facet arthropathy on x-ray. I think a large pain generator for her is her left SI joint. Discussed options. She would like to delay diagnostic and therapeutic SI injection. Will try a course of Cymbalta. Started 30 mg daily. Continue physical therapy. Recheck in a few weeks.

## 2015-09-04 ENCOUNTER — Encounter: Payer: Self-pay | Admitting: Rehabilitative and Restorative Service Providers"

## 2015-09-04 ENCOUNTER — Ambulatory Visit (INDEPENDENT_AMBULATORY_CARE_PROVIDER_SITE_OTHER): Payer: 59 | Admitting: Rehabilitative and Restorative Service Providers"

## 2015-09-04 DIAGNOSIS — M25652 Stiffness of left hip, not elsewhere classified: Secondary | ICD-10-CM

## 2015-09-04 DIAGNOSIS — M25552 Pain in left hip: Secondary | ICD-10-CM

## 2015-09-04 DIAGNOSIS — M545 Low back pain, unspecified: Secondary | ICD-10-CM

## 2015-09-04 DIAGNOSIS — M25551 Pain in right hip: Secondary | ICD-10-CM

## 2015-09-04 DIAGNOSIS — M25651 Stiffness of right hip, not elsewhere classified: Secondary | ICD-10-CM | POA: Diagnosis not present

## 2015-09-04 DIAGNOSIS — R531 Weakness: Secondary | ICD-10-CM | POA: Diagnosis not present

## 2015-09-04 NOTE — Patient Instructions (Signed)
Abdominal Bracing With Pelvic Floor (Hook-Lying)    With neutral spine, tighten pelvic floor and abdominals sucking belly button to back bone, tighten muscles in back at waist. Hold 10 sec Repeat __10_ times. Do ___ times a day.  Piriformis Stretch    Lying on back, pull right knee toward opposite shoulder. Hold __20-30__ seconds. Repeat __3__ times. Do __2-3__ sessions per day.       Reach, Kneeling   Start from hands and knees cat/cow exercise - keep spine arched, engaging core as you sit back to heels  Kneel and sit backward onto heels. Push chest toward floor, reaching forward as far as possible. Hold _10-15__ seconds. Repeat _3-5__ times per session. Do _2-3__ sessions per day.

## 2015-09-04 NOTE — Therapy (Signed)
[place], [place], [address] Phone: [phone]   Fax:  [phone]  Physical Therapy Treatment  Patient Details  Name: Julie Burton MRN: [id number] Date of Birth: 29-Dec-1960 Referring Provider: Dr Georgina Snell  Encounter Date: 09/04/2015      PT End of Session - 09/04/15 0857    Visit Number 3   Number of Visits 6   Date for PT Re-Evaluation 08/28/15   PT Start Time 0853   PT Stop Time 0944   PT Time Calculation (min) 51 min   Activity Tolerance Patient tolerated treatment well      Past Medical History  Diagnosis Date  . Arthritis   . GERD (gastroesophageal reflux disease)     Past Surgical History  Procedure Laterality Date  . Bunionectomy  2008  . Tubal ligation    . Tonsillectomy    . Cesarean section      There were no vitals filed for this visit.  Visit Diagnosis:  Weakness generalized  Bilateral low back pain without sciatica  Bilateral hip pain  Stiffness of hip joint, right  Stiffness of hip joint, left      Subjective Assessment - 09/04/15 0858    Subjective Pt reports that she is improving. The hip is imroving and she is focused on engaging core, even when she is walking which helps with the back pain during the day. Started some medication that is helping.    Currently in Pain? Yes   Pain Score 3    Pain Location Back   Pain Orientation Right;Left   Pain Descriptors / Indicators Tightness                         [facility] Adult PT Treatment/Exercise - 09/04/15 0001    Lumbar Exercises: Stretches   Passive Hamstring Stretch 30 seconds;3 reps   Hip Flexor Stretch 3 reps;30 seconds   Press Ups 5 reps  3 sec hold   Piriformis Stretch 2 reps;30 seconds   Lumbar Exercises: Aerobic   Stationary Bike NuStep L6: 5 min    Lumbar Exercises: Supine   AB Set Limitations 3 part core 10 sec hold x 10    Lumbar Exercises: Prone   Other Prone Lumbar  Exercises pelivc press, then with SLR and bend knee SLR   Lumbar Exercises: Quadruped   Madcat/Old Horse 10 reps  felt deep stretch with end range sag    Madcat/Old Horse Limitations sit back for stretch 10 sec hold 3 reps maintaining core and arch    Knee/Hip Exercises: Stretches   Piriformis Stretch 3 reps;30 seconds   Piriformis Stretch Limitations instructed in diagnonal knee to chest as well   Modalities   Modalities Electrical Stimulation;Moist Heat   Moist Heat Therapy   Number Minutes Moist Heat 15 Minutes   Moist Heat Location Lumbar Spine   Electrical Stimulation   Electrical Stimulation Location bilat L5/Si; bilat iliac crest   Electrical Stimulation Action IFC   Electrical Stimulation Parameters to tolerance   Electrical Stimulation Goals Pain   Manual Therapy   Manual Therapy Myofascial release;Soft tissue mobilization   Soft tissue mobilization lumbar paraspinals/QL   Myofascial Release lumbar spine                 PT Education - 09/04/15 0918    Education provided Yes   Education Details myofacial ball release; HEP   Person(s) Educated Patient   Methods Explanation;Demonstration;Tactile cues;Verbal  cues;Handout   Comprehension Verbalized understanding;Returned demonstration;Verbal cues required;Tactile cues required             PT Long Term Goals - 09/04/15 1211    PT LONG TERM GOAL #1   Title i with advanced HEP ( 10/16/15)   Time 6   Period Weeks   Status Revised   PT LONG TERM GOAL #2   Title perform  core ther ex with good contraction of TA and multifidi ( 10/02/15)   Time 4   Period Weeks   Status Revised   PT LONG TERM GOAL #3   Title report pain decrease =/> 50-75% overall ( 10/16/15)   Time 6   Period Weeks   Status Revised   PT LONG TERM GOAL #4   Title improve FOTO =/< 32% limited ( 10/16/15)   Time 6   Period Weeks   Status Revised               Plan - 09/04/15 1202    Clinical Impression Statement Good stretch with  sag with cat/horse stretch; difficulty with segmental spinal mobility; difficutly with 3 part core. Progressing well with goals of therapy.    Pt will benefit from skilled therapeutic intervention in order to improve on the following deficits Decreased strength;Pain;Increased muscle spasms   Rehab Potential Good   PT Frequency 2x / week   PT Duration 3 weeks   PT Treatment/Interventions ADLs/Self Care Home Management;Moist Heat;Traction;Therapeutic exercise;Taping;Manual techniques;Cryotherapy;Neuromuscular re-education;Electrical Stimulation;Patient/family education;Passive range of motion;Dry needling   PT Next Visit Plan Continue manual therapy to pelvis stabilizers/ LB to improve imbalance.  Continue core strengthening exercises and postural awareness eduction (related to work).    PT Home Exercise Plan HEP; core stabilization; stretching; strengthening   Consulted and Agree with Plan of Care Patient        Problem List Patient Active Problem List   Diagnosis Date Noted  . Lumbago 08/01/2015    Geral Coker Nilda Simmer PT, MPH 09/04/2015, 1:57 PM  [facility] [place], [place], [address] Phone: [phone]   Fax:  [phone]  Name: Julie Burton MRN: [id number] Date of Birth: [birth year]

## 2015-09-05 ENCOUNTER — Encounter: Payer: Self-pay | Admitting: Rehabilitative and Restorative Service Providers"

## 2015-09-05 ENCOUNTER — Ambulatory Visit (INDEPENDENT_AMBULATORY_CARE_PROVIDER_SITE_OTHER): Payer: 59 | Admitting: Rehabilitative and Restorative Service Providers"

## 2015-09-05 ENCOUNTER — Ambulatory Visit (INDEPENDENT_AMBULATORY_CARE_PROVIDER_SITE_OTHER): Payer: 59 | Admitting: Family Medicine

## 2015-09-05 ENCOUNTER — Encounter: Payer: Self-pay | Admitting: Family Medicine

## 2015-09-05 VITALS — BP 121/59 | HR 72 | Wt 137.0 lb

## 2015-09-05 DIAGNOSIS — M25551 Pain in right hip: Secondary | ICD-10-CM | POA: Diagnosis not present

## 2015-09-05 DIAGNOSIS — M545 Low back pain, unspecified: Secondary | ICD-10-CM

## 2015-09-05 DIAGNOSIS — G8929 Other chronic pain: Secondary | ICD-10-CM | POA: Diagnosis not present

## 2015-09-05 DIAGNOSIS — R531 Weakness: Secondary | ICD-10-CM

## 2015-09-05 DIAGNOSIS — M25651 Stiffness of right hip, not elsewhere classified: Secondary | ICD-10-CM | POA: Diagnosis not present

## 2015-09-05 DIAGNOSIS — M25552 Pain in left hip: Secondary | ICD-10-CM

## 2015-09-05 DIAGNOSIS — M25652 Stiffness of left hip, not elsewhere classified: Secondary | ICD-10-CM

## 2015-09-05 MED ORDER — DULOXETINE HCL 30 MG PO CPEP: 30.0000 mg | ORAL_CAPSULE | Freq: Every day | ORAL | Status: DC

## 2015-09-05 NOTE — Therapy (Signed)
[place], [place], [address] Phone: [phone]   Fax:  [phone]  Physical Therapy Treatment  Patient Details  Name: Julie Burton MRN: [id number] Date of Birth: 09/04/1961 Referring Provider: Dr Georgina Snell  Encounter Date: 09/05/2015      PT End of Session - 09/05/15 0943    Visit Number 4   Number of Visits 6   Date for PT Re-Evaluation 08/28/15   PT Start Time 0935   PT Stop Time 1030   PT Time Calculation (min) 55 min   Activity Tolerance Patient tolerated treatment well      Past Medical History  Diagnosis Date  . Arthritis   . GERD (gastroesophageal reflux disease)     Past Surgical History  Procedure Laterality Date  . Bunionectomy  2008  . Tubal ligation    . Tonsillectomy    . Cesarean section      There were no vitals filed for this visit.  Visit Diagnosis:  Weakness generalized  Bilateral low back pain without sciatica  Bilateral hip pain  Stiffness of hip joint, right  Stiffness of hip joint, left      Subjective Assessment - 09/05/15 0938    Subjective Some increased soreness in the Lt hip now a little higher now mostly in Lt hip. notes less radiating pain into the lateral hip area. Did a few exercises and is working on 3 part core.    Currently in Pain? Yes   Pain Score 3    Pain Location Hip   Pain Orientation Left   Pain Descriptors / Indicators Aching;Sore   Pain Type Chronic pain                         [facility] Adult PT Treatment/Exercise - 09/05/15 0001    Lumbar Exercises: Stretches   Passive Hamstring Stretch 30 seconds;3 reps   Hip Flexor Stretch 3 reps;30 seconds   Press Ups 5 reps  3 sec hold   Piriformis Stretch 2 reps;30 seconds   Lumbar Exercises: Aerobic   Stationary Bike NuStep L6: 5 min    Lumbar Exercises: Supine   AB Set Limitations 3 part core 10 sec hold x 10    Lumbar Exercises: Prone   Other Prone Lumbar  Exercises pelivc press, then with SLR and bend knee SLR   Lumbar Exercises: Quadruped   Madcat/Old Horse 10 reps  felt deep stretch with end range sag    Madcat/Old Horse Limitations sit back for stretch 10 sec hold 3 reps maintaining core and arch - added walking hands to side hold 15 sec x 3 each side    Knee/Hip Exercises: Stretches   Piriformis Stretch 3 reps;30 seconds   Piriformis Stretch Limitations diagnonal knee to chest as well   Modalities   Modalities Electrical Stimulation;Moist Heat   Moist Heat Therapy   Number Minutes Moist Heat 15 Minutes   Moist Heat Location Lumbar Spine   Electrical Stimulation   Electrical Stimulation Location bilat L5/Si; bilat iliac crest   Electrical Stimulation Action IFC   Electrical Stimulation Parameters to tolerance   Electrical Stimulation Goals Pain   Manual Therapy   Manual Therapy Myofascial release;Soft tissue mobilization   Soft tissue mobilization lumbar paraspinals/QL   Myofascial Release lumbar spine                 PT Education - 09/05/15 1000    Education provided Yes  Education Details HEP    Person(s) Educated Patient   Methods Explanation;Demonstration;Tactile cues;Verbal cues;Handout   Comprehension Verbalized understanding;Returned demonstration;Verbal cues required;Tactile cues required             PT Long Term Goals - 09/04/15 1211    PT LONG TERM GOAL #1   Title i with advanced HEP ( 10/16/15)   Time 6   Period Weeks   Status Revised   PT LONG TERM GOAL #2   Title perform  core ther ex with good contraction of TA and multifidi ( 10/02/15)   Time 4   Period Weeks   Status Revised   PT LONG TERM GOAL #3   Title report pain decrease =/> 50-75% overall ( 10/16/15)   Time 6   Period Weeks   Status Revised   PT LONG TERM GOAL #4   Title improve FOTO =/< 32% limited ( 10/16/15)   Time 6   Period Weeks   Status Revised               Plan - 09/05/15 1017    Clinical Impression Statement  improving spinal mobility L4/5; L5/S1. Added lateral trunk flexion with good response. Improved core stabilization exercise. Less pain into hip more proximal today.    Pt will benefit from skilled therapeutic intervention in order to improve on the following deficits Decreased strength;Pain;Increased muscle spasms   Rehab Potential Good   PT Frequency 2x / week   PT Duration 3 weeks   PT Treatment/Interventions ADLs/Self Care Home Management;Moist Heat;Traction;Therapeutic exercise;Taping;Manual techniques;Cryotherapy;Neuromuscular re-education;Electrical Stimulation;Patient/family education;Passive range of motion;Dry needling   PT Next Visit Plan Continue manual therapy to pelvis stabilizers/ LB to improve imbalance.  Continue core strengthening exercises and postural awareness eduction (related to work).    PT Home Exercise Plan HEP; core stabilization; stretching; strengthening   Consulted and Agree with Plan of Care Patient        Problem List Patient Active Problem List   Diagnosis Date Noted  . Lumbago 08/01/2015    Celyn Nilda Simmer PT, MPH 09/05/2015, 10:20 AM  [facility] [place], [place], [address] Phone: [phone]   Fax:  [phone]  Name: Julie Burton MRN: [id number] Date of Birth: [birth year]

## 2015-09-05 NOTE — Assessment & Plan Note (Signed)
Much improved with Cymbalta. Continue current dose. Return in 6 months as needed.

## 2015-09-05 NOTE — Patient Instructions (Signed)
Side Twist, Kneeling   Arch and sag  Then arch back and hold as you sit back onto heels  Kneel, buttocks on heels. Fold upper body forward from hips. Then reach to each side as far as possible, keeping chest low toward floor. Hold each position __15-20_ seconds. Repeat _3-4__ times per session. Do _2-3__ sessions per day.

## 2015-09-05 NOTE — Patient Instructions (Signed)
Thank you for coming in today. Return in 6 months or sooner if needed

## 2015-09-05 NOTE — Progress Notes (Signed)
Julie Burton is a 54 y.o. female who presents to [facility]: [facility]  today for follow-up back pain. Patient was seen a few weeks ago for her at this point becoming chronic back pain. She was started on Cymbalta 30 and feels much better. She is able to work and do activities despite the mild pain. No radiating pain weakness or numbness. She would like to stay at Cymbalta 30 mg daily.   Past Medical History  Diagnosis Date  . Arthritis   . GERD (gastroesophageal reflux disease)    Past Surgical History  Procedure Laterality Date  . Bunionectomy  2008  . Tubal ligation    . Tonsillectomy    . Cesarean section     Social History  Substance Use Topics  . Smoking status: Never Smoker   . Smokeless tobacco: Not on file  . Alcohol Use: No   family history includes COPD in her mother; Cancer in her mother; Depression in her mother; Diabetes in her mother; Heart disease in her mother and paternal grandfather; Hyperlipidemia in her mother; Hypertension in her mother; Miscarriages / Stillbirths in her paternal grandmother.  ROS as above Medications: Current Outpatient Prescriptions  Medication Sig Dispense Refill  . CycloSPORINE (RESTASIS OP) Apply to eye.    . DULoxetine (CYMBALTA) 30 MG capsule Take 1 capsule (30 mg total) by mouth daily. 90 capsule 1  . KRILL OIL PO Take by mouth.    . Naproxen Sodium (ALEVE PO) Take by mouth.     No current facility-administered medications for this visit.   Not on File   Exam:  BP 121/59 mmHg  Pulse 72  Wt 137 lb (62.143 kg) Gen: Well NAD MSK: Nontender to spinal midline normal back motion. Normal gait and strength.  No results found for this or any previous visit (from the past 24 hour(s)). No results found.   Please see individual assessment and plan sections.

## 2015-09-10 ENCOUNTER — Ambulatory Visit (INDEPENDENT_AMBULATORY_CARE_PROVIDER_SITE_OTHER): Payer: 59 | Admitting: Physical Therapy

## 2015-09-10 DIAGNOSIS — M25552 Pain in left hip: Secondary | ICD-10-CM

## 2015-09-10 DIAGNOSIS — M545 Low back pain, unspecified: Secondary | ICD-10-CM

## 2015-09-10 DIAGNOSIS — M25651 Stiffness of right hip, not elsewhere classified: Secondary | ICD-10-CM

## 2015-09-10 DIAGNOSIS — R531 Weakness: Secondary | ICD-10-CM

## 2015-09-10 DIAGNOSIS — M25551 Pain in right hip: Secondary | ICD-10-CM | POA: Diagnosis not present

## 2015-09-10 DIAGNOSIS — M25652 Stiffness of left hip, not elsewhere classified: Secondary | ICD-10-CM

## 2015-09-10 NOTE — Therapy (Signed)
[place], [place], [address] Phone: [phone]   Fax:  [phone]  Physical Therapy Treatment  Patient Details  Name: Julie Burton MRN: [id number] Date of Birth: [birth year] Referring Provider: Dr. Georgina Snell   Encounter Date: 09/10/2015      PT End of Session - 09/10/15 1407    Visit Number 5   Number of Visits 6   Date for PT Re-Evaluation 08/28/15   PT Start Time [id number]   PT Stop Time 1448   PT Time Calculation (min) 46 min   Activity Tolerance Patient tolerated treatment well      Past Medical History  Diagnosis Date  . Arthritis   . GERD (gastroesophageal reflux disease)     Past Surgical History  Procedure Laterality Date  . Bunionectomy  2008  . Tubal ligation    . Tonsillectomy    . Cesarean section      There were no vitals filed for this visit.  Visit Diagnosis:  Weakness generalized  Bilateral low back pain without sciatica  Bilateral hip pain  Stiffness of hip joint, right  Stiffness of hip joint, left      Subjective Assessment - 09/10/15 1408    Subjective Pt reports she has been working hard on 3 part core. hasn't done as much HEP as she would have liked over weekend.    Currently in Pain? Yes   Pain Score 2    Pain Location Back   Pain Orientation Lower   Pain Descriptors / Indicators Aching   Pain Radiating Towards to left hip    Aggravating Factors  positional    Pain Relieving Factors heat             [facility] PT Assessment - 09/10/15 0001    Assessment   Medical Diagnosis Chronic LBP without sciatica   Referring Provider Dr. Georgina Snell    Onset Date/Surgical Date 08/06/13   Next MD Visit PRN          [facility] Adult PT Treatment/Exercise - 09/10/15 0001    Lumbar Exercises: Prone   Other Prone Lumbar Exercises childs pose with lateral trunk flexion x 20 sec x 2 reps each side.    Other Prone Lumbar Exercises pelvic press with unilateral knee flexion  x 10 reps each leg    Lumbar Exercises: Quadruped   Madcat/Old Horse 10 reps   Madcat/Old Horse Limitations (also did side to side stretch in this position)    Knee/Hip Exercises: Seated   Other Seated Knee/Hip Exercises Pelvic tilts (ant /post, side to side) x 10 each direction on green therapy ball.  Pelvic circles x 10 each direction. VC for form.    Modalities   Modalities Electrical Stimulation;Moist Heat;Ultrasound   Moist Heat Therapy   Number Minutes Moist Heat 15 Minutes   Moist Heat Location Lumbar Spine   Electrical Stimulation   Electrical Stimulation Location bilat L5/Si; bilat iliac crest   Electrical Stimulation Action IFC   Electrical Stimulation Parameters to tolerance   Electrical Stimulation Goals Pain   Ultrasound   Ultrasound Location bilat lumbar paraspinals (L4-5)   Ultrasound Parameters 1.2 w/cm2, 8 min , 100%    Ultrasound Goals Pain   Manual Therapy   Manual Therapy Soft tissue mobilization   Soft tissue mobilization lumbar paraspinals/QL           PT Long Term Goals - 09/04/15 1211    PT LONG TERM GOAL #1   Title i with  advanced HEP ( 10/16/15)   Time 6   Period Weeks   Status Revised   PT LONG TERM GOAL #2   Title perform  core ther ex with good contraction of TA and multifidi ( 10/02/15)   Time 4   Period Weeks   Status Revised   PT LONG TERM GOAL #3   Title report pain decrease =/> 50-75% overall ( 10/16/15)   Time 6   Period Weeks   Status Revised   PT LONG TERM GOAL #4   Title improve FOTO =/< 32% limited ( 10/16/15)   Time 6   Period Weeks   Status Revised               Plan - 09/10/15 1439    Clinical Impression Statement Pt demonstrated improved spinal mobility of lower lumbar spine with cat/cow and improved multifidus contraction.  Progressing towards goals.    Pt will benefit from skilled therapeutic intervention in order to improve on the following deficits Decreased strength;Pain;Increased muscle spasms   Rehab Potential  Good   PT Frequency 2x / week   PT Duration 3 weeks   PT Treatment/Interventions ADLs/Self Care Home Management;Moist Heat;Traction;Therapeutic exercise;Taping;Manual techniques;Cryotherapy;Neuromuscular re-education;Electrical Stimulation;Patient/family education;Passive range of motion;Dry needling   PT Next Visit Plan Continue manual therapy to pelvis stabilizers/ LB to improve imbalance.  Continue core strengthening exercises and postural awareness eduction (related to work). Assess need for additional therapy sessions vs d/c to HEP.  FOTO.    Consulted and Agree with Plan of Care Patient        Problem List Patient Active Problem List   Diagnosis Date Noted  . Lumbago 08/01/2015    Kerin Perna, PTA 09/10/2015 2:46 PM  [place], [place], [address] Phone: [phone]   Fax:  [phone]  Name: Julie Burton MRN: [id number] Date of Birth: [birth year]

## 2015-09-12 ENCOUNTER — Ambulatory Visit (INDEPENDENT_AMBULATORY_CARE_PROVIDER_SITE_OTHER): Payer: 59 | Admitting: Physical Therapy

## 2015-09-12 DIAGNOSIS — M25551 Pain in right hip: Secondary | ICD-10-CM

## 2015-09-12 DIAGNOSIS — M25552 Pain in left hip: Secondary | ICD-10-CM | POA: Diagnosis not present

## 2015-09-12 DIAGNOSIS — M545 Low back pain, unspecified: Secondary | ICD-10-CM

## 2015-09-12 DIAGNOSIS — R531 Weakness: Secondary | ICD-10-CM | POA: Diagnosis not present

## 2015-09-12 NOTE — Therapy (Signed)
[place], [place], [address] Phone: [phone]   Fax:  [phone]  Physical Therapy Treatment  Patient Details  Name: Julie Burton MRN: [id number] Date of Birth: 05/02/1961 Referring Provider: Dr. Georgina Snell  Encounter Date: 09/12/2015      PT End of Session - 09/12/15 0937    Visit Number 6   Number of Visits 10   Date for PT Re-Evaluation 10/10/15   PT Start Time 0934   PT Stop Time 1027   PT Time Calculation (min) 53 min      Past Medical History  Diagnosis Date  . Arthritis   . GERD (gastroesophageal reflux disease)     Past Surgical History  Procedure Laterality Date  . Bunionectomy  2008  . Tubal ligation    . Tonsillectomy    . Cesarean section      There were no vitals filed for this visit.  Visit Diagnosis:  Weakness generalized - Plan: PT plan of care cert/re-cert  Bilateral low back pain without sciatica - Plan: PT plan of care cert/re-cert  Bilateral hip pain - Plan: PT plan of care cert/re-cert      Subjective Assessment - 09/12/15 0938    Subjective Pt reports she had pain relief for remainder of day of last treatment. Feels she is improving.    Currently in Pain? Yes   Pain Score 3    Pain Location Back   Pain Orientation --  across belt line   Pain Descriptors / Indicators Aching   Aggravating Factors  positional   Pain Relieving Factors heat             [facility] PT Assessment - 09/12/15 0001    Assessment   Medical Diagnosis Chronic LBP without sciatica   Referring Provider Dr. Georgina Snell   Onset Date/Surgical Date 08/06/13   Next MD Visit PRN           [facility] Adult PT Treatment/Exercise - 09/12/15 0001    Lumbar Exercises: Aerobic   Stationary Bike L2: 5 min    Lumbar Exercises: Sidelying   Clam 10 reps  with core engaged.    Clam Limitations then donkey kicks x 8 reps each side.    Other Sidelying Lumbar Exercises modified side plank and side  plank with hip dips. x 5 reps each side.  some cramping in Lt hip with Rt side plank   Lumbar Exercises: Prone   Other Prone Lumbar Exercises childs pose with lateral trunk flexion x 20 sec x 2 reps each side.    Other Prone Lumbar Exercises Pelvic presses x 5 reps, then with unilateral knee bend x 10 reps each leg, then with bilateral knee bend x 10.    Lumbar Exercises: Quadruped   Madcat/Old Horse 5 reps   Opposite Arm/Leg Raise Right arm/Left leg;Left arm/Right leg;10 reps   Opposite Arm/Leg Raise Limitations VC to not rotate pelvis on Lt, this reduced discomfort.    Plank high and low plank with knee touches x 5 reps x 2 sets   Knee/Hip Exercises: Stretches   Piriformis Stretch 3 reps;30 seconds   Modalities   Modalities Electrical Stimulation;Moist Heat   Moist Heat Therapy   Number Minutes Moist Heat 15 Minutes   Moist Heat Location Lumbar Spine   Electrical Stimulation   Electrical Stimulation Location bilat L5/Si; bilat iliac crest   Electrical Stimulation Action IFC   Electrical Stimulation Parameters to tolerance   Electrical Stimulation Goals Pain  PT Long Term Goals - 09/12/15 1105    PT LONG TERM GOAL #1   Title i with advanced HEP ( 10/10/15)   Time 4   Period Weeks   Status On-going   PT LONG TERM GOAL #2   Title perform  core ther ex with good contraction of TA and multifidi ( 10/10/15)   Time 4   Period Weeks   Status Partially Met   PT LONG TERM GOAL #3   Title report pain decrease =/> 50-75% overall ( 10/10/15)   Time 4   Period Weeks   Status On-going  reported close to 50% reduction    PT LONG TERM GOAL #4   Title improve FOTO =/< 32% limited ( 10/10/15)   Time 4   Period Weeks   Status On-going   PT LONG TERM GOAL #5   Title ambulate with minimal pain =/> 2 miles ( 10/10/15   Time 4   Period Weeks   Status New               Plan - 09/12/15 1106    Clinical Impression Statement Pt tolerated exercises well; some early  muscle fatigue in Lt hip. Pt has some difficulty with even multifidus contraction with pelvic presses; improved with tactile cues and repetition.  Pt has partially met goals, desires to continue therapy to meet established goals.  Pt would benefit from continuation of PT to decrease pain with functional mobility.    Pt will benefit from skilled therapeutic intervention in order to improve on the following deficits Decreased strength;Pain;Increased muscle spasms   Rehab Potential Good   PT Treatment/Interventions ADLs/Self Care Home Management;Moist Heat;Traction;Therapeutic exercise;Taping;Manual techniques;Cryotherapy;Neuromuscular re-education;Electrical Stimulation;Patient/family education;Passive range of motion;Dry needling   PT Next Visit Plan Spoke to supervising PT regarding goals and pt's progress. Will recert for additonal visit.  Continue core/ hip strengthening.    Consulted and Agree with Plan of Care Patient        Problem List Patient Active Problem List   Diagnosis Date Noted  . Lumbago 08/01/2015   Kerin Perna, PTA 09/12/2015 2:32 PM  Jeral Pinch, PT 09/12/2015 2:32 PM   [place], [place], [address] Phone: [phone]   Fax:  [phone]  Name: Julie Burton MRN: [id number] Date of Birth: 06/20/1961

## 2015-09-17 ENCOUNTER — Other Ambulatory Visit: Payer: Self-pay | Admitting: Family Medicine

## 2015-09-18 ENCOUNTER — Ambulatory Visit (INDEPENDENT_AMBULATORY_CARE_PROVIDER_SITE_OTHER): Payer: 59 | Admitting: Physical Therapy

## 2015-09-18 ENCOUNTER — Encounter: Payer: Self-pay | Admitting: Physical Therapy

## 2015-09-18 DIAGNOSIS — R531 Weakness: Secondary | ICD-10-CM | POA: Diagnosis not present

## 2015-09-18 DIAGNOSIS — M545 Low back pain, unspecified: Secondary | ICD-10-CM

## 2015-09-18 DIAGNOSIS — M25651 Stiffness of right hip, not elsewhere classified: Secondary | ICD-10-CM

## 2015-09-18 DIAGNOSIS — M25551 Pain in right hip: Secondary | ICD-10-CM | POA: Diagnosis not present

## 2015-09-18 DIAGNOSIS — M25552 Pain in left hip: Secondary | ICD-10-CM

## 2015-09-18 DIAGNOSIS — M25652 Stiffness of left hip, not elsewhere classified: Secondary | ICD-10-CM

## 2015-09-18 NOTE — Therapy (Signed)
[place], [place], [address] Phone: [phone]   Fax:  [phone]  Physical Therapy Treatment  Patient Details  Name: Julie Burton MRN: [id number] Date of Birth: 03/27/1961 Referring Provider: Dr. Georgina Snell  Encounter Date: 09/18/2015      PT End of Session - 09/18/15 0840    Visit Number 7   Number of Visits 10   Date for PT Re-Evaluation 10/10/15   PT Start Time 0840   PT Stop Time 0936   PT Time Calculation (min) 56 min   Activity Tolerance Patient tolerated treatment well      Past Medical History  Diagnosis Date  . Arthritis   . GERD (gastroesophageal reflux disease)     Past Surgical History  Procedure Laterality Date  . Bunionectomy  2008  . Tubal ligation    . Tonsillectomy    . Cesarean section      There were no vitals filed for this visit.  Visit Diagnosis:  Weakness generalized  Bilateral low back pain without sciatica  Bilateral hip pain  Stiffness of hip joint, right  Stiffness of hip joint, left      Subjective Assessment - 09/18/15 0842    Subjective Pt reports she is doing good. Patient can tell when she works the weekends she has soreness because she can't do her exercise.   Sitting for a long time will also call some discomfort.    Currently in Pain? No/denies                         [facility] Adult PT Treatment/Exercise - 09/18/15 0001    Exercises   Exercises Lumbar   Lumbar Exercises: Stretches   Prone Mid Back Stretch --  cat/cow   Quad Stretch 30 seconds  prone with strap   ITB Stretch 2 reps;30 seconds  cross body with strap   Lumbar Exercises: Aerobic   Stationary Bike L4: 5 min    Lumbar Exercises: Standing   Functional Squats 10 reps  x2, hinge at hips with bar and holding yellow ball   Lifting 10 reps  FWD reach holding yellow ball, 10 sec holds.    Other Standing Lumbar Exercises trunk rotation with leg turn holding  yellow ball   Other Standing Lumbar Exercises standing hip extension 2x10   Lumbar Exercises: Supine   Bridge 10 reps;Non-compliant  10 sec holds, feet on ball   Bridge Limitations 2x10 reps articulating bridges   Other Supine Lumbar Exercises table top with heel taps 2x10   Moist Heat Therapy   Number Minutes Moist Heat 15 Minutes   Moist Heat Location Lumbar Spine   Electrical Stimulation   Electrical Stimulation Location bilat L5/Si; bilat iliac crest   Electrical Stimulation Action IFC   Electrical Stimulation Parameters to tolerance   Electrical Stimulation Goals Pain                  PT Short Term Goals - 09/12/15 1409    PT SHORT TERM GOAL #1   Title I with initial HEP   Status Achieved           PT Long Term Goals - 09/18/15 3094    PT LONG TERM GOAL #1   Title i with advanced HEP ( 10/10/15)   Status On-going   PT LONG TERM GOAL #2   Title perform  core ther ex with good contraction of TA and multifidi ( 10/10/15)  Status Partially Met   PT LONG TERM GOAL #3   Title report pain decrease =/> 50-75% overall ( 10/10/15)   Status On-going   PT LONG TERM GOAL #4   Title improve FOTO =/< 32% limited ( 10/10/15)   Status On-going   PT LONG TERM GOAL #5   Title ambulate with minimal pain =/> 2 miles ( 10/10/15   Status On-going               Plan - 09/18/15 0848    Clinical Impression Statement Pt is pleased with her progress, only has discomfort with long work shifts and prolonged sitting now.  Doing better with her core work, strength improving. She does have some " creaking" in her low back with ther ex.     Pt will benefit from skilled therapeutic intervention in order to improve on the following deficits Decreased strength;Pain;Increased muscle spasms   PT Frequency 1x / week   PT Duration 3 weeks   PT Treatment/Interventions ADLs/Self Care Home Management;Moist Heat;Traction;Therapeutic exercise;Taping;Manual  techniques;Cryotherapy;Neuromuscular re-education;Electrical Stimulation;Patient/family education;Passive range of motion;Dry needling   PT Next Visit Plan continue core stabilization, issue more core ex for variation   Consulted and Agree with Plan of Care Patient        Problem List Patient Active Problem List   Diagnosis Date Noted  . Lumbago 08/01/2015    Jeral Pinch PT  09/18/2015, 9:18 AM  [facility] [place], [place], [address] Phone: [phone]   Fax:  [phone]  Name: Julie Burton MRN: [id number] Date of Birth: May 07, 1961

## 2015-09-25 ENCOUNTER — Ambulatory Visit (INDEPENDENT_AMBULATORY_CARE_PROVIDER_SITE_OTHER): Payer: 59 | Admitting: Physical Therapy

## 2015-09-25 DIAGNOSIS — M25552 Pain in left hip: Secondary | ICD-10-CM

## 2015-09-25 DIAGNOSIS — M25551 Pain in right hip: Secondary | ICD-10-CM

## 2015-09-25 DIAGNOSIS — R531 Weakness: Secondary | ICD-10-CM | POA: Diagnosis not present

## 2015-09-25 DIAGNOSIS — M545 Low back pain, unspecified: Secondary | ICD-10-CM

## 2015-09-25 NOTE — Patient Instructions (Signed)
One-Leg Bridge With Knee [place]    Lie on back, one leg on top of ball, knee slightly flexed, other leg lifted toward ceiling, arms on floor. Tighten buttocks, keeping abdominals tight and raise hips  off floor. Do _1__ sets of _10-15__ repetitions. Advanced: Do not use arms for support.  Ball Roll: Intermediate    With hands on ball and back straight, begin to roll forward, progressively tensing abdominals. Caution: Do not hyperextend low back. Breathing out, roll back to start position. Do __1__ sets. Complete _10___ repetitions. Oblique Abdominal Side Plank: Lowering (Eccentric)    Lie on side on feet and elbow. Lift trunk. Slowly lower trunk for 3-5 seconds. 8-10 reps_1-2__ days per week. Progress to lifting arm.   [facility] [place], [place] [address]  [phone] (office) [phone] (fax)

## 2015-09-25 NOTE — Therapy (Signed)
[place], [place], [address] Phone: [phone]   Fax:  [phone]  Physical Therapy Treatment  Patient Details  Name: Ranesha Val MRN: [id number] Date of Birth: December 05, 1960 Referring Provider: Dr. Georgina Snell  Encounter Date: 09/25/2015      PT End of Session - 09/25/15 0808    Visit Number 8   Number of Visits 10   Date for PT Re-Evaluation 10/10/15   PT Start Time 0808  pt arrived late   PT Stop Time 0858   PT Time Calculation (min) 50 min   Activity Tolerance Patient tolerated treatment well      Past Medical History  Diagnosis Date  . Arthritis   . GERD (gastroesophageal reflux disease)     Past Surgical History  Procedure Laterality Date  . Bunionectomy  2008  . Tubal ligation    . Tonsillectomy    . Cesarean section      There were no vitals filed for this visit.  Visit Diagnosis:  Weakness generalized  Bilateral low back pain without sciatica  Bilateral hip pain      Subjective Assessment - 09/25/15 0810    Subjective "I did a big session on my eliptical on Friday, and I'm still feeling it".   (4 days ago). Pt reports she had back pain afterward 4/10 but no hip issues.  Pt reports she feels the weather is affecting her body.   New exercises going well.    Currently in Pain? Yes   Pain Score 4    Pain Location Back   Pain Orientation --  across belt line   Pain Descriptors / Indicators Aching   Pain Frequency Constant   Aggravating Factors  positional    Pain Relieving Factors heat             [facility] PT Assessment - 09/25/15 0001    Assessment   Medical Diagnosis Chronic LBP without sciatica   Referring Provider Dr. Georgina Snell   Onset Date/Surgical Date 08/06/13   Next MD Visit PRN                     [facility] Adult PT Treatment/Exercise - 09/25/15 0001    Lumbar Exercises: Stretches   Lower Trunk Rotation 2 reps;20 seconds   Quad Stretch 2 reps;20  seconds  each side, standing   Lumbar Exercises: Aerobic   Stationary Bike L5: 5 min   Lumbar Exercises: Standing   Functional Squats 10 reps  with weighted yellow ball (deep). mirror for feedback   Other Standing Lumbar Exercises trunk rotation with leg turn holding yellow ball x 8 reps each side (VC and mirror for feedback)   Lumbar Exercises: Supine   Bridge 10 reps;Non-compliant  10 sec holds, feet on ball   Bridge Limitations with single leg lift x 10 reps (challenging)    Lumbar Exercises: Prone   Other Prone Lumbar Exercises HIGH KNEELING: ball roll outs with green therapy ball x 8 reps    Other Prone Lumbar Exercises pelvic press with hip ext (knee straight x 10 reps each leg) and hip ext (knee bent x 10 each leg).    Knee/Hip Exercises: Stretches   Passive Hamstring Stretch Right;Left;2 reps;30 seconds   Piriformis Stretch Right;Left;2 reps;30 seconds   Other Knee/Hip Stretches ITB stretch with strap x 30 sec x 2 reps each leg   Modalities   Modalities Electrical Stimulation;Moist Heat   Moist Heat Therapy   Number Minutes  Moist Heat 15 Minutes   Moist Heat Location Lumbar Spine   Electrical Stimulation   Electrical Stimulation Location bilat L5/Si; bilat iliac crest   Electrical Stimulation Action IFC   Electrical Stimulation Parameters to tolerance    Electrical Stimulation Goals Pain                PT Education - 09/25/15 0849    Education provided Yes   Education Details HEP   Person(s) Educated Patient   Methods Explanation;Handout   Comprehension Verbalized understanding          PT Short Term Goals - 09/12/15 1409    PT SHORT TERM GOAL #1   Title I with initial HEP   Status Achieved           PT Long Term Goals - 09/18/15 7017    PT LONG TERM GOAL #1   Title i with advanced HEP ( 10/10/15)   Status On-going   PT LONG TERM GOAL #2   Title perform  core ther ex with good contraction of TA and multifidi ( 10/10/15)   Status Partially Met    PT LONG TERM GOAL #3   Title report pain decrease =/> 50-75% overall ( 10/10/15)   Status On-going   PT LONG TERM GOAL #4   Title improve FOTO =/< 32% limited ( 10/10/15)   Status On-going   PT LONG TERM GOAL #5   Title ambulate with minimal pain =/> 2 miles ( 10/10/15   Status On-going               Plan - 09/25/15 0846    Clinical Impression Statement Pt tolerated exercises without increase in pain.  Pt reported decrease in pain with use of estim and MHP at end of session.  Progressing well towards remaining goals, reporting 50% reduction of symptoms since beginning therapy.    Pt will benefit from skilled therapeutic intervention in order to improve on the following deficits Decreased strength;Pain;Increased muscle spasms   Rehab Potential Good   PT Frequency 1x / week   PT Duration 3 weeks   PT Treatment/Interventions ADLs/Self Care Home Management;Moist Heat;Traction;Therapeutic exercise;Taping;Manual techniques;Cryotherapy;Neuromuscular re-education;Electrical Stimulation;Patient/family education;Passive range of motion;Dry needling   PT Next Visit Plan continue core stabilization   Consulted and Agree with Plan of Care Patient        Problem List Patient Active Problem List   Diagnosis Date Noted  . Lumbago 08/01/2015   Kerin Perna, PTA 09/25/2015 8:51 AM  [facility] [place], [place], [address] Phone: [phone]   Fax:  [phone]  Name: Raylan Troiani MRN: [id number] Date of Birth: 09-13-1961

## 2015-10-02 ENCOUNTER — Encounter: Payer: 59 | Admitting: Physical Therapy

## 2015-10-09 ENCOUNTER — Ambulatory Visit (INDEPENDENT_AMBULATORY_CARE_PROVIDER_SITE_OTHER): Payer: 59 | Admitting: Physical Therapy

## 2015-10-09 ENCOUNTER — Encounter: Payer: Self-pay | Admitting: Physical Therapy

## 2015-10-09 DIAGNOSIS — M25551 Pain in right hip: Secondary | ICD-10-CM

## 2015-10-09 DIAGNOSIS — M545 Low back pain, unspecified: Secondary | ICD-10-CM

## 2015-10-09 DIAGNOSIS — M25552 Pain in left hip: Secondary | ICD-10-CM

## 2015-10-09 DIAGNOSIS — R531 Weakness: Secondary | ICD-10-CM | POA: Diagnosis not present

## 2015-10-09 DIAGNOSIS — M25652 Stiffness of left hip, not elsewhere classified: Secondary | ICD-10-CM

## 2015-10-09 DIAGNOSIS — M25651 Stiffness of right hip, not elsewhere classified: Secondary | ICD-10-CM

## 2015-10-09 NOTE — Patient Instructions (Signed)
  Abdominal Bracing With Pelvic Floor (Hook-Lying)   With neutral spine, tighten pelvic floor and abdominals. Hold 10 seconds. Repeat __10_ times. Do _1__ times a day.   Knee to Chest: Transverse Plane Stability   Bring one knee up, then return. Be sure pelvis does not roll side to side. Keep pelvis still. Lift knee __10_ times each leg. Restabilize pelvis. Repeat with other leg. Do _1-2__ sets, _1__ times per day.   Hip External Rotation With Pillow: Transverse Plane Stability   One knee bent, one leg straight, on pillow. Slowly roll bent knee out. Be sure pelvis does not rotate. Do _10__ times. Restabilize pelvis. Repeat with other leg. Do _1-2__ sets, _1__ times per day.  Heel Slide: 4-10 Inches - Transverse Plane Stability   Slide heel 4 inches down. Be sure pelvis does not rotate. Do _10__ times. Restabilize pelvis. Repeat with other leg. Do __1_ sets, _1__ times per day.   [facility] [address] [place], [place] [address]  [phone] (office) [phone] (fax)   

## 2015-10-09 NOTE — Therapy (Signed)
[place], [place], [address] Phone: [phone]   Fax:  [phone]  Physical Therapy Treatment  Patient Details  Name: Julie Burton MRN: [id number] Date of Birth: 1960-12-10 Referring Provider: Dr Georgina Snell  Encounter Date: 10/09/2015      PT End of Session - 10/09/15 0812    Visit Number 8   Date for PT Re-Evaluation 10/10/15   PT Start Time 0809  Pt was late   PT Stop Time 0845   PT Time Calculation (min) 36 min      Past Medical History  Diagnosis Date  . Arthritis   . GERD (gastroesophageal reflux disease)     Past Surgical History  Procedure Laterality Date  . Bunionectomy  2008  . Tubal ligation    . Tonsillectomy    . Cesarean section      There were no vitals filed for this visit.  Visit Diagnosis:  Weakness generalized  Bilateral low back pain without sciatica  Bilateral hip pain  Stiffness of hip joint, right  Stiffness of hip joint, left      Subjective Assessment - 10/09/15 0813    Subjective Pt states she is feeling good and ready for discharge.    Currently in Pain? Yes   Pain Score 2    Pain Location Back   Pain Descriptors / Indicators Dull   Pain Type Chronic pain   Aggravating Factors  long hard shifts at work   Pain Relieving Factors stretching            [facility] PT Assessment - 10/09/15 0001    Assessment   Medical Diagnosis Chronic LBP without sciatica   Referring Provider Dr Georgina Snell   Onset Date/Surgical Date 08/06/13   Next MD Visit PRN   Observation/Other Assessments   Focus on Therapeutic Outcomes (FOTO)  28% limited   AROM   AROM Assessment Site Lumbar   Lumbar Flexion WNL   Lumbar Extension WNl   Lumbar - Right Side Bend WNL   Lumbar - Left Side Bend WNL   Lumbar - Right Rotation WNL   Lumbar - Left Rotation WNL   Strength   Strength Assessment Site Lumbar   Right/Left Hip Left;Right   Right Hip Flexion 5/5   Right Hip  Extension 5/5   Right Hip ABduction 5/5   Left Hip Flexion 5/5   Left Hip Extension 5/5   Left Hip ABduction 5/5                     [facility] Adult PT Treatment/Exercise - 10/09/15 0001    Lumbar Exercises: Stretches   Passive Hamstring Stretch 2 reps;30 seconds  with strap   ITB Stretch 2 reps;30 seconds  cross body with strap    Lumbar Exercises: Aerobic   Stationary Bike Nustep L5x5'   Lumbar Exercises: Supine   Ab Set 5 reps  10 sec   Clam 20 reps  VC for form   Heel Slides 20 reps  VC for form   Bent Knee Raise 20 reps  VC for form   Bridge 10 reps;Non-compliant   Other Supine Lumbar Exercises table top with heel taps 2x10                PT Education - 10/09/15 0823    Education provided Yes   Education Details reviewed stretching and added new TA ex   Person(s) Educated Patient   Methods Explanation;Demonstration;Handout   Comprehension Returned  demonstration;Verbalized understanding          PT Short Term Goals - 09/12/15 1409    PT SHORT TERM GOAL #1   Title I with initial HEP   Status Achieved           PT Long Term Goals - 10/09/15 0820    PT LONG TERM GOAL #1   Title i with advanced HEP ( 10/10/15)   Status Achieved   PT LONG TERM GOAL #2   Title perform  core ther ex with good contraction of TA and multifidi ( 10/10/15)   Status Achieved   PT LONG TERM GOAL #3   Title report pain decrease =/> 50-75% overall ( 10/10/15)   Status Achieved  75% improvement   PT LONG TERM GOAL #4   Title improve FOTO =/< 32% limited ( 10/10/15)   Status Achieved  scored 28% limited   PT LONG TERM GOAL #5   Title ambulate with minimal pain =/> 2 miles ( 10/10/15   Status Achieved               Plan - 10/09/15 0840    Clinical Impression Statement Pt is doing really well, she has met all her goals and ready for d/c to HEP   PT Next Visit Plan D/C        Problem List Patient Active Problem List   Diagnosis Date Noted  .  Lumbago 08/01/2015    Jeral Pinch PT 10/09/2015, 10:41 AM  [facility] [place], [place], [address] Phone: [phone]   Fax:  [phone]  Name: Julie Burton MRN: [id number] Date of Birth: January 22, 1961    PHYSICAL THERAPY DISCHARGE SUMMARY  Visits from Start of Care: 8  Current functional level related to goals / functional outcomes: See above   Remaining deficits: Intermittent pain with heavy shifts at work   Education / Equipment: HEP Plan: Patient agrees to discharge.  Patient goals were .met. Patient is being discharged due to meeting the stated rehab goals.  ?????   Jeral Pinch, PT 10/09/2015 10:41 AM

## 2015-12-24 MED FILL — DULoxetine HCL 30 MG CPEP: 30 | 90 days supply | Qty: 90 | Fill #1

## 2016-02-14 ENCOUNTER — Other Ambulatory Visit (HOSPITAL_COMMUNITY)
Admission: RE | Admit: 2016-02-14 | Discharge: 2016-02-14 | Disposition: A | Discharge status: Home / Self Care | Payer: 59 | Source: Ambulatory Visit | Attending: Nurse Practitioner | Admitting: Nurse Practitioner

## 2016-02-14 ENCOUNTER — Other Ambulatory Visit: Payer: Self-pay | Admitting: Nurse Practitioner

## 2016-02-14 DIAGNOSIS — Z1151 Encounter for screening for human papillomavirus (HPV): Secondary | ICD-10-CM | POA: Diagnosis not present

## 2016-02-14 DIAGNOSIS — N8189 Other female genital prolapse: Secondary | ICD-10-CM | POA: Diagnosis not present

## 2016-02-14 DIAGNOSIS — R87810 Cervical high risk human papillomavirus (HPV) DNA test positive: Secondary | ICD-10-CM | POA: Diagnosis not present

## 2016-02-14 DIAGNOSIS — Z01419 Encounter for gynecological examination (general) (routine) without abnormal findings: Secondary | ICD-10-CM | POA: Insufficient documentation

## 2016-02-18 LAB — CYTOLOGY - PAP

## 2016-03-05 ENCOUNTER — Ambulatory Visit (INDEPENDENT_AMBULATORY_CARE_PROVIDER_SITE_OTHER): Payer: 59 | Admitting: Family Medicine

## 2016-03-05 DIAGNOSIS — Z0000 Encounter for general adult medical examination without abnormal findings: Secondary | ICD-10-CM | POA: Diagnosis not present

## 2016-03-05 DIAGNOSIS — Z780 Asymptomatic menopausal state: Secondary | ICD-10-CM

## 2016-03-05 DIAGNOSIS — Z1159 Encounter for screening for other viral diseases: Secondary | ICD-10-CM

## 2016-03-05 DIAGNOSIS — Z5329 Procedure and treatment not carried out because of patient's decision for other reasons: Secondary | ICD-10-CM

## 2016-03-05 DIAGNOSIS — Z114 Encounter for screening for human immunodeficiency virus [HIV]: Secondary | ICD-10-CM

## 2016-03-05 NOTE — Addendum Note (Signed)
Addended by: Gregor Hams on: 03/05/2016 01:13 PM   Modules accepted: Orders

## 2016-03-05 NOTE — Addendum Note (Signed)
Addended by: Gregor Hams on: 03/05/2016 12:45 PM   Modules accepted: Level of Service

## 2016-03-05 NOTE — Progress Notes (Addendum)
Patient arrived on time but was not recognized as being here. She will reschedule. No charge for no-show.

## 2016-03-06 ENCOUNTER — Ambulatory Visit (INDEPENDENT_AMBULATORY_CARE_PROVIDER_SITE_OTHER): Payer: 59 | Admitting: Family Medicine

## 2016-03-06 ENCOUNTER — Encounter: Payer: Self-pay | Admitting: Family Medicine

## 2016-03-06 VITALS — BP 118/76 | HR 62 | Wt 138.0 lb

## 2016-03-06 DIAGNOSIS — D509 Iron deficiency anemia, unspecified: Secondary | ICD-10-CM

## 2016-03-06 DIAGNOSIS — Z0000 Encounter for general adult medical examination without abnormal findings: Secondary | ICD-10-CM | POA: Diagnosis not present

## 2016-03-06 DIAGNOSIS — IMO0001 Reserved for inherently not codable concepts without codable children: Secondary | ICD-10-CM

## 2016-03-06 DIAGNOSIS — M79643 Pain in unspecified hand: Secondary | ICD-10-CM

## 2016-03-06 LAB — CBC
HEMATOCRIT: 36.9 % (ref 35.0–45.0)
HEMOGLOBIN: 11.7 g/dL (ref 11.7–15.5)
MCH: 25.5 pg — AB (ref 27.0–33.0)
MCHC: 31.7 g/dL — AB (ref 32.0–36.0)
MCV: 80.6 fL (ref 80.0–100.0)
MPV: 10.1 fL (ref 7.5–12.5)
Platelets: 396 10*3/uL (ref 140–400)
RBC: 4.58 MIL/uL (ref 3.80–5.10)
RDW: 16.7 % — ABNORMAL HIGH (ref 11.0–15.0)
WBC: 5.6 10*3/uL (ref 3.8–10.8)

## 2016-03-06 LAB — HEMOGLOBIN A1C
Hgb A1c MFr Bld: 5.9 % — ABNORMAL HIGH (ref <5.7)
Mean Plasma Glucose: 123 mg/dL

## 2016-03-06 LAB — IRON AND TIBC
%SAT: 5 % — ABNORMAL LOW (ref 11–50)
Iron: 25 ug/dL — ABNORMAL LOW (ref 45–160)
TIBC: 502 ug/dL — ABNORMAL HIGH (ref 250–450)
UIBC: 477 ug/dL — ABNORMAL HIGH (ref 125–400)

## 2016-03-06 LAB — COMPREHENSIVE METABOLIC PANEL
ALK PHOS: 67 U/L (ref 33–130)
ALT: 13 U/L (ref 6–29)
AST: 20 U/L (ref 10–35)
Albumin: 4.5 g/dL (ref 3.6–5.1)
BUN: 11 mg/dL (ref 7–25)
CO2: 22 mmol/L (ref 20–31)
CREATININE: 0.79 mg/dL (ref 0.50–1.05)
Calcium: 9.6 mg/dL (ref 8.6–10.4)
Chloride: 104 mmol/L (ref 98–110)
GLUCOSE: 89 mg/dL (ref 65–99)
POTASSIUM: 4.4 mmol/L (ref 3.5–5.3)
SODIUM: 136 mmol/L (ref 135–146)
Total Bilirubin: 0.4 mg/dL (ref 0.2–1.2)
Total Protein: 7.7 g/dL (ref 6.1–8.1)

## 2016-03-06 LAB — LIPID PANEL
Cholesterol: 263 mg/dL — ABNORMAL HIGH (ref 125–200)
HDL: 75 mg/dL (ref >=46)
LDL CALC: 159 mg/dL — AB (ref <130)
TRIGLYCERIDES: 145 mg/dL (ref <150)
Total CHOL/HDL Ratio: 3.5 Ratio (ref <=5.0)
VLDL: 29 mg/dL (ref <30)

## 2016-03-06 LAB — FERRITIN: Ferritin: 7 ng/mL — ABNORMAL LOW (ref 10–232)

## 2016-03-06 MED ORDER — ZOSTER VACCINE LIVE 19400 UNT/0.65ML ~~LOC~~ SUSR: 0.6500 mL | Freq: Once | SUBCUTANEOUS | Status: DC

## 2016-03-06 MED ORDER — DICLOFENAC SODIUM 1 % TD GEL: 2.0000 g | Freq: Four times a day (QID) | TRANSDERMAL | Status: DC

## 2016-03-06 MED FILL — DICLOFENAC SODIUM 1% GEL: 1 | 10 days supply | Qty: 100 | Fill #0

## 2016-03-06 NOTE — Progress Notes (Signed)
Julie Burton is a 55 y.o. female who presents to [facility] today for well adult check.  Julie Burton is a healthy 55 year old woman with a few active chronic medical conditions mostly related to pain. She notes in the past she had been diagnosed with iron deficiency anemia due to heavy menstrual bleeding. She continues to have a menstrual period monthly. She denies feeling lightheaded or dizzy. Additionally she's had some back pain issues that are typically pretty well controlled with intermittent physical therapy Cymbalta and occasional diclofenac gel. She's feeling pretty well with her back pain now.  She works as a Marine scientist at [employer] and has 6 children all of whom are now adults and out of the house. She currently is married to her husband and has one dog. She feels safe at home and denies any significant depression or anxiety symptoms.  She notes a family history for diabetes and breast cancer and would like lab testing to evaluate for her risk for diabetes. She is currently fasting today.   Past Medical History  Diagnosis Date  . Arthritis   . GERD (gastroesophageal reflux disease)    Past Surgical History  Procedure Laterality Date  . Bunionectomy  2008  . Tubal ligation    . Tonsillectomy    . Cesarean section     Social History  Substance Use Topics  . Smoking status: Never Smoker   . Smokeless tobacco: Not on file  . Alcohol Use: No   family history includes COPD in her mother; Cancer in her mother; Depression in her mother; Diabetes in her mother; Heart disease in her mother and paternal grandfather; Hyperlipidemia in her mother; Hypertension in her mother; Miscarriages / Stillbirths in her paternal grandmother.  ROS as above:  Medications: Current Outpatient Prescriptions  Medication Sig Dispense Refill  . CycloSPORINE (RESTASIS OP) Apply to  eye.    . DULoxetine (CYMBALTA) 30 MG capsule Take 1 capsule (30 mg total) by mouth daily. 90 capsule 1  . KRILL OIL PO Take by mouth.    . Naproxen Sodium (ALEVE PO) Take by mouth.    . diclofenac sodium (VOLTAREN) 1 % GEL Apply 2 g topically 4 (four) times daily. To affected joint. 100 g 11   No current facility-administered medications for this visit.   No Known Allergies   Exam:  BP 118/76 mmHg  Pulse 62  Wt 138 lb (62.596 kg) Gen: Well NAD Nontoxic appearing HEENT: EOMI,  MMM Lungs: Normal work of breathing. CTABL Heart: RRR no MRG Abd: NABS, Soft. Nondistended, Nontender Exts: Brisk capillary refill, warm and well perfused.  Back is nontender to spinal midline normal back motion. Psych: Alert and oriented processandaffect. PHQ 2: 0  No results found for this or any previous visit (from the past 24 hour(s)). No results found.    Assessment and Plan: 55 y.o. female with   1) Well visit: Doing well. No significant issues identified today. Obtain basic fasting labs as per normal wellness visit care. Additionally we'll check iron stores for possible iron deficiency anemia. Vaccines and health maintenance up-to-date with the exception of hep C and HIV Check HIV and hep C labs to complete other health maintenance items. Patient is still menstruating therefore not currently candidate for osteoporosis screening. Discussed options patient would like shingles vaccination.  2) Pain: DDD related likely. Discussed options. Physical therapy as needed. Continue Cymbalta. Prescribed Voltaren gel. Return as needed.  Discussed warning  signs or symptoms. Please see discharge instructions. Patient expresses understanding.

## 2016-03-06 NOTE — Patient Instructions (Signed)
Thank you for coming in today. Get labs today.  We will call with results.  Let me know when you are ready for injections as needed.  Just call and ask if you want to do more PT.

## 2016-03-07 ENCOUNTER — Encounter: Payer: Self-pay | Admitting: Family Medicine

## 2016-03-07 DIAGNOSIS — R7303 Prediabetes: Secondary | ICD-10-CM | POA: Insufficient documentation

## 2016-03-07 DIAGNOSIS — D509 Iron deficiency anemia, unspecified: Secondary | ICD-10-CM | POA: Insufficient documentation

## 2016-03-07 DIAGNOSIS — E559 Vitamin D deficiency, unspecified: Secondary | ICD-10-CM | POA: Insufficient documentation

## 2016-03-07 LAB — HEPATITIS C ANTIBODY: HCV AB: NEGATIVE

## 2016-03-07 LAB — VITAMIN D 25 HYDROXY (VIT D DEFICIENCY, FRACTURES): Vit D, 25-Hydroxy: 15 ng/mL — ABNORMAL LOW (ref 30–100)

## 2016-03-07 LAB — HIV ANTIBODY (ROUTINE TESTING W REFLEX): HIV 1&2 Ab, 4th Generation: NONREACTIVE

## 2016-03-07 NOTE — Progress Notes (Signed)
Quick Note:  1) Vitamin D deficiency noted. Take 5000 units of vitamin D daily over-the-counter.  2) You have mild prediabetes with an A1c of 5.9. Try to follow a low carb diet.  3) Cholesterol is not great. Your risk is still pretty small. 4) You have iron deficiency anemia. I recommend taking iron pill 2-3 x daily.  If you would like a referral to OBGYN we can get this set up for you. ______

## 2016-03-07 NOTE — Progress Notes (Signed)
Called and left detailed vm advising pt that the shingles rx has been sent. Also requested that she have the pharmacy fax [place] verification of administration so that we could update her chart.

## 2016-03-12 ENCOUNTER — Telehealth: Payer: Self-pay | Admitting: Family Medicine

## 2016-03-12 DIAGNOSIS — N939 Abnormal uterine and vaginal bleeding, unspecified: Secondary | ICD-10-CM

## 2016-03-12 NOTE — Telephone Encounter (Signed)
Will refer to [facility]

## 2016-04-02 ENCOUNTER — Other Ambulatory Visit: Payer: Self-pay | Admitting: Family Medicine

## 2016-04-02 MED FILL — DULoxetine HCL 30 MG CPEP: 30 | 90 days supply | Qty: 90 | Fill #0

## 2016-06-30 MED FILL — DULoxetine HCL 30 MG CPEP: 30 | 90 days supply | Qty: 90 | Fill #1

## 2016-07-17 ENCOUNTER — Encounter: Payer: Self-pay | Admitting: Family Medicine

## 2016-07-17 ENCOUNTER — Ambulatory Visit (INDEPENDENT_AMBULATORY_CARE_PROVIDER_SITE_OTHER): Payer: 59 | Admitting: Family Medicine

## 2016-07-17 VITALS — BP 134/73 | HR 68 | Wt 138.0 lb

## 2016-07-17 DIAGNOSIS — D500 Iron deficiency anemia secondary to blood loss (chronic): Secondary | ICD-10-CM | POA: Diagnosis not present

## 2016-07-17 DIAGNOSIS — N920 Excessive and frequent menstruation with regular cycle: Secondary | ICD-10-CM | POA: Insufficient documentation

## 2016-07-17 DIAGNOSIS — K2100 Gastro-esophageal reflux disease with esophagitis, without bleeding: Secondary | ICD-10-CM

## 2016-07-17 LAB — COMPLETE METABOLIC PANEL WITH GFR
ALT: 17 U/L (ref 6–29)
AST: 21 U/L (ref 10–35)
Albumin: 4.4 g/dL (ref 3.6–5.1)
Alkaline Phosphatase: 63 U/L (ref 33–130)
BUN: 9 mg/dL (ref 7–25)
CHLORIDE: 107 mmol/L (ref 98–110)
CO2: 27 mmol/L (ref 20–31)
Calcium: 9.7 mg/dL (ref 8.6–10.4)
Creat: 0.91 mg/dL (ref 0.50–1.05)
GFR, EST NON AFRICAN AMERICAN: 72 mL/min (ref >=60)
GFR, Est African American: 83 mL/min (ref >=60)
GLUCOSE: 73 mg/dL (ref 65–99)
POTASSIUM: 4.5 mmol/L (ref 3.5–5.3)
SODIUM: 141 mmol/L (ref 135–146)
Total Bilirubin: 0.5 mg/dL (ref 0.2–1.2)
Total Protein: 7.2 g/dL (ref 6.1–8.1)

## 2016-07-17 LAB — IRON AND TIBC
%SAT: 51 % — AB (ref 11–50)
IRON: 192 ug/dL — AB (ref 45–160)
TIBC: 377 ug/dL (ref 250–450)
UIBC: 185 ug/dL (ref 125–400)

## 2016-07-17 LAB — CBC
HCT: 41.4 % (ref 35.0–45.0)
HEMOGLOBIN: 14 g/dL (ref 11.7–15.5)
MCH: 30.5 pg (ref 27.0–33.0)
MCHC: 33.8 g/dL (ref 32.0–36.0)
MCV: 90.2 fL (ref 80.0–100.0)
MPV: 10.9 fL (ref 7.5–12.5)
Platelets: 325 10*3/uL (ref 140–400)
RBC: 4.59 MIL/uL (ref 3.80–5.10)
RDW: 13.8 % (ref 11.0–15.0)
WBC: 5.2 10*3/uL (ref 3.8–10.8)

## 2016-07-17 LAB — LIPASE: Lipase: 30 U/L (ref 7–60)

## 2016-07-17 LAB — FERRITIN: FERRITIN: 30 ng/mL (ref 10–232)

## 2016-07-17 MED ORDER — PANTOPRAZOLE SODIUM 40 MG PO TBEC: 40.0000 mg | DELAYED_RELEASE_TABLET | Freq: Two times a day (BID) | ORAL | 3 refills | Status: DC

## 2016-07-17 MED ORDER — SUCRALFATE 1 GM/10ML PO SUSP
1.0000 g | Freq: Three times a day (TID) | ORAL | 1 refills | Status: DC
Start: 2016-07-17 — End: 2016-08-18

## 2016-07-17 MED FILL — PANTOPRAZOLE SOD DR 40 MG T: 40 | 30 days supply | Qty: 60 | Fill #0

## 2016-07-17 MED FILL — CARAFATE 1 GM/10 ML SUSP: 1 | 11 days supply | Qty: 420 | Fill #0

## 2016-07-17 NOTE — Patient Instructions (Signed)
Thank you for coming in today. Go schedule OBGYN.  Take protonix twice daily and carafate 4x daily.  Return in 1 month.  Get labs today.

## 2016-07-17 NOTE — Progress Notes (Signed)
Julie Burton is a 55 y.o. female who presents to [facility] today for reflux symptoms.  Patient has a long standing history of GERD and is status post Nissen in 2000.  Since then, her reflux symptoms have been well controlled without any medications.  Over the past week she has noticed regurgitation, sore throat, and cough.  Patient has tried omeprazole BID of an unknown dose but hasn't had any relief.  She denies hoarseness and chest pain.  Her regurgitation is constant, worse after meals and lying flat.  She denies fever, chills, congestion, fatigue, and myalgias.  Patient denies dietary changes and frequent NSAID use.    Additionally, patient was seen here previously for heavy menstrual bleeding and iron deficiency.  She was referred to OB/GYN but never made an appointment.  She wants another referral today.  Patient received her flu shot at work.   Past Medical History:  Diagnosis Date  . Arthritis   . GERD (gastroesophageal reflux disease)    Past Surgical History:  Procedure Laterality Date  . BUNIONECTOMY  2008  . CESAREAN SECTION    . TONSILLECTOMY    . TUBAL LIGATION     Social History  Substance Use Topics  . Smoking status: Never Smoker  . Smokeless tobacco: Not on file  . Alcohol use No   family history includes COPD in her mother; Cancer in her mother; Depression in her mother; Diabetes in her mother; Heart disease in her mother and paternal grandfather; Hyperlipidemia in her mother; Hypertension in her mother; Miscarriages / Stillbirths in her paternal grandmother.  ROS as above:  Medications: Current Outpatient Prescriptions  Medication Sig Dispense Refill  . CycloSPORINE (RESTASIS OP) Apply to eye.    . diclofenac sodium (VOLTAREN) 1 % GEL Apply 2 g topically 4 (four) times daily. To affected joint. 100 g 11  . DULoxetine (CYMBALTA) 30 MG  capsule TAKE 1 CAPSULE BY MOUTH ONCE DAILY 90 capsule 1  . KRILL OIL PO Take by mouth.    . Naproxen Sodium (ALEVE PO) Take by mouth.    . pantoprazole (PROTONIX) 40 MG tablet Take 1 tablet (40 mg total) by mouth 2 (two) times daily. 60 tablet 3  . sucralfate (CARAFATE) 1 GM/10ML suspension Take 10 mLs (1 g total) by mouth 4 (four) times daily -  with meals and at bedtime. 420 mL 1   No current facility-administered medications for this visit.    No Known Allergies   Exam:  BP 134/73   Pulse 68   Wt 138 lb (62.6 kg)   BMI 26.07 kg/m  Gen: Well NAD HEENT: EOMI,  MMM Lungs: Normal work of breathing. CTABL Heart: RRR no MRG Abd: NABS, Soft. Nondistended, Nontender Exts: Brisk capillary refill, warm and well perfused.   No results found for this or any previous visit (from the past 24 hour(s)). No results found.    Assessment and Plan: 55 y.o. female with:  1.  Reflux symptoms for the past week  - Pantoprazole 40 mg BID - Carafate liquid  - follow up in 1 month, if not improved we will refer to GI for EGD - CMP and lipase  2.  Heavy menstrual bleeding and iron deficiency - Referral to OB/GYN - CBC - Iron, ferritin, and TIBC   Orders Placed This Encounter  Procedures  . CBC  . COMPLETE METABOLIC PANEL WITH GFR  . Iron and TIBC  . Ferritin  .  Lipase  . Ambulatory referral to Obstetrics / Gynecology    Referral Priority:   Routine    Referral Type:   Consultation    Referral Reason:   Specialty Services Required    Requested Specialty:   Obstetrics and Gynecology    Number of Visits Requested:   1    Discussed warning signs or symptoms. Please see discharge instructions. Patient expresses understanding.

## 2016-07-28 DIAGNOSIS — Z803 Family history of malignant neoplasm of breast: Secondary | ICD-10-CM | POA: Diagnosis not present

## 2016-07-28 DIAGNOSIS — Z1231 Encounter for screening mammogram for malignant neoplasm of breast: Secondary | ICD-10-CM | POA: Diagnosis not present

## 2016-08-14 ENCOUNTER — Ambulatory Visit: Payer: 59 | Admitting: Family Medicine

## 2016-08-14 DIAGNOSIS — R928 Other abnormal and inconclusive findings on diagnostic imaging of breast: Secondary | ICD-10-CM | POA: Diagnosis not present

## 2016-08-18 ENCOUNTER — Ambulatory Visit: Payer: 59 | Admitting: Family Medicine

## 2016-08-18 ENCOUNTER — Ambulatory Visit (INDEPENDENT_AMBULATORY_CARE_PROVIDER_SITE_OTHER): Payer: 59 | Admitting: Family Medicine

## 2016-08-18 ENCOUNTER — Encounter: Payer: Self-pay | Admitting: Family Medicine

## 2016-08-18 VITALS — BP 125/71 | HR 72 | Wt 137.0 lb

## 2016-08-18 DIAGNOSIS — N920 Excessive and frequent menstruation with regular cycle: Secondary | ICD-10-CM

## 2016-08-18 DIAGNOSIS — M7542 Impingement syndrome of left shoulder: Secondary | ICD-10-CM

## 2016-08-18 DIAGNOSIS — G5602 Carpal tunnel syndrome, left upper limb: Secondary | ICD-10-CM

## 2016-08-18 DIAGNOSIS — D500 Iron deficiency anemia secondary to blood loss (chronic): Secondary | ICD-10-CM | POA: Diagnosis not present

## 2016-08-18 DIAGNOSIS — K2990 Gastroduodenitis, unspecified, without bleeding: Secondary | ICD-10-CM

## 2016-08-18 DIAGNOSIS — K2970 Gastritis, unspecified, without bleeding: Secondary | ICD-10-CM

## 2016-08-18 DIAGNOSIS — G5603 Carpal tunnel syndrome, bilateral upper limbs: Secondary | ICD-10-CM | POA: Insufficient documentation

## 2016-08-18 NOTE — Patient Instructions (Signed)
Thank you for coming in today. Return in 6-12 months or sooner if needed.   Look up [facility] or the [facility]   Let me know if the wrists or neck gets worse.    Impingement Syndrome, Rotator Cuff, Bursitis With Rehab Impingement syndrome is a condition that involves inflammation of the tendons of the rotator cuff and the subacromial bursa, that causes pain in the shoulder. The rotator cuff consists of four tendons and muscles that control much of the shoulder and upper arm function. The subacromial bursa is a fluid filled sac that helps reduce friction between the rotator cuff and one of the bones of the shoulder (acromion). Impingement syndrome is usually an overuse injury that causes swelling of the bursa (bursitis), swelling of the tendon (tendonitis), and/or a tear of the tendon (strain). Strains are classified into three categories. Grade 1 strains cause pain, but the tendon is not lengthened. Grade 2 strains include a lengthened ligament, due to the ligament being stretched or partially ruptured. With grade 2 strains there is still function, although the function may be decreased. Grade 3 strains include a complete tear of the tendon or muscle, and function is usually impaired. SYMPTOMS   Pain around the shoulder, often at the outer portion of the upper arm.  Pain that gets worse with shoulder function, especially when reaching overhead or lifting.  Sometimes, aching when not using the arm.  Pain that wakes you up at night.  Sometimes, tenderness, swelling, warmth, or redness over the affected area.  Loss of strength.  Limited motion of the shoulder, especially reaching behind the back (to the back pocket or to unhook bra) or across your body.  Crackling sound (crepitation) when moving the arm.  Biceps tendon pain and inflammation (in the front of the shoulder). Worse when bending the elbow or lifting. CAUSES  Impingement syndrome is often an overuse injury, in which chronic  (repetitive) motions cause the tendons or bursa to become inflamed. A strain occurs when a force is paced on the tendon or muscle that is greater than it can withstand. Common mechanisms of injury include: Stress from sudden increase in duration, frequency, or intensity of training.  Direct hit (trauma) to the shoulder.  Aging, erosion of the tendon with normal use.  Bony bump on shoulder (acromial spur). RISK INCREASES WITH:  Contact sports (football, wrestling, boxing).  Throwing sports (baseball, tennis, volleyball).  Weightlifting and bodybuilding.  Heavy labor.  Previous injury to the rotator cuff, including impingement.  Poor shoulder strength and flexibility.  Failure to warm up properly before activity.  Inadequate protective equipment.  Old age.  Bony bump on shoulder (acromial spur). PREVENTION   Warm up and stretch properly before activity.  Allow for adequate recovery between workouts.  Maintain physical fitness:  Strength, flexibility, and endurance.  Cardiovascular fitness.  Learn and use proper exercise technique. PROGNOSIS  If treated properly, impingement syndrome usually goes away within 6 weeks. Sometimes surgery is required.  RELATED COMPLICATIONS   Longer healing time if not properly treated, or if not given enough time to heal.  Recurring symptoms, that result in a chronic condition.  Shoulder stiffness, frozen shoulder, or loss of motion.  Rotator cuff tendon tear.  Recurring symptoms, especially if activity is resumed too soon, with overuse, with a direct blow, or when using poor technique. TREATMENT  Treatment first involves the use of ice and medicine, to reduce pain and inflammation. The use of strengthening and stretching exercises may help reduce pain  with activity. These exercises may be performed at home or with a therapist. If non-surgical treatment is unsuccessful after more than 6 months, surgery may be advised. After surgery  and rehabilitation, activity is usually possible in 3 months.  MEDICATION  If pain medicine is needed, nonsteroidal anti-inflammatory medicines (aspirin and ibuprofen), or other minor pain relievers (acetaminophen), are often advised.  Do not take pain medicine for 7 days before surgery.  Prescription pain relievers may be given, if your caregiver thinks they are needed. Use only as directed and only as much as you need.  Corticosteroid injections may be given by your caregiver. These injections should be reserved for the most serious cases, because they may only be given a certain number of times. HEAT AND COLD  Cold treatment (icing) should be applied for 10 to 15 minutes every 2 to 3 hours for inflammation and pain, and immediately after activity that aggravates your symptoms. Use ice packs or an ice massage.  Heat treatment may be used before performing stretching and strengthening activities prescribed by your caregiver, physical therapist, or athletic trainer. Use a heat pack or a warm water soak. SEEK MEDICAL CARE IF:   Symptoms get worse or do not improve in 4 to 6 weeks, despite treatment.  New, unexplained symptoms develop. (Drugs used in treatment may produce side effects.) EXERCISES  RANGE OF MOTION (ROM) AND STRETCHING EXERCISES - Impingement Syndrome (Rotator Cuff  Tendinitis, Bursitis) These exercises may help you when beginning to rehabilitate your injury. Your symptoms may go away with or without further involvement from your physician, physical therapist or athletic trainer. While completing these exercises, remember:   Restoring tissue flexibility helps normal motion to return to the joints. This allows healthier, less painful movement and activity.  An effective stretch should be held for at least 30 seconds.  A stretch should never be painful. You should only feel a gentle lengthening or release in the stretched tissue. STRETCH - Flexion, Standing  Stand with good  posture. With an underhand grip on your right / left hand, and an overhand grip on the opposite hand, grasp a broomstick or cane so that your hands are a little more than shoulder width apart.  Keeping your right / left elbow straight and shoulder muscles relaxed, push the stick with your opposite hand, to raise your right / left arm in front of your body and then overhead. Raise your arm until you feel a stretch in your right / left shoulder, but before you have increased shoulder pain.  Try to avoid shrugging your right / left shoulder as your arm rises, by keeping your shoulder blade tucked down and toward your mid-back spine. Hold for __________ seconds.  Slowly return to the starting position. Repeat __________ times. Complete this exercise __________ times per day. STRETCH - Abduction, Supine  Lie on your back. With an underhand grip on your right / left hand and an overhand grip on the opposite hand, grasp a broomstick or cane so that your hands are a little more than shoulder width apart.  Keeping your right / left elbow straight and your shoulder muscles relaxed, push the stick with your opposite hand, to raise your right / left arm out to the side of your body and then overhead. Raise your arm until you feel a stretch in your right / left shoulder, but before you have increased shoulder pain.  Try to avoid shrugging your right / left shoulder as your arm rises, by keeping your  shoulder blade tucked down and toward your mid-back spine. Hold for __________ seconds.  Slowly return to the starting position. Repeat __________ times. Complete this exercise __________ times per day. ROM - Flexion, Active-Assisted  Lie on your back. You may bend your knees for comfort.  Grasp a broomstick or cane so your hands are about shoulder width apart. Your right / left hand should grip the end of the stick, so that your hand is positioned "thumbs-up," as if you were about to shake hands.  Using your  healthy arm to lead, raise your right / left arm overhead, until you feel a gentle stretch in your shoulder. Hold for __________ seconds.  Use the stick to assist in returning your right / left arm to its starting position. Repeat __________ times. Complete this exercise __________ times per day.  ROM - Internal Rotation, Supine   Lie on your back on a firm surface. Place your right / left elbow about 60 degrees away from your side. Elevate your elbow with a folded towel, so that the elbow and shoulder are the same height.  Using a broomstick or cane and your strong arm, pull your right / left hand toward your body until you feel a gentle stretch, but no increase in your shoulder pain. Keep your shoulder and elbow in place throughout the exercise.  Hold for __________ seconds. Slowly return to the starting position. Repeat __________ times. Complete this exercise __________ times per day. STRETCH - Internal Rotation  Place your right / left hand behind your back, palm up.  Throw a towel or belt over your opposite shoulder. Grasp the towel with your right / left hand.  While keeping an upright posture, gently pull up on the towel, until you feel a stretch in the front of your right / left shoulder.  Avoid shrugging your right / left shoulder as your arm rises, by keeping your shoulder blade tucked down and toward your mid-back spine.  Hold for __________ seconds. Release the stretch, by lowering your healthy hand. Repeat __________ times. Complete this exercise __________ times per day. ROM - Internal Rotation   Using an underhand grip, grasp a stick behind your back with both hands.  While standing upright with good posture, slide the stick up your back until you feel a mild stretch in the front of your shoulder.  Hold for __________ seconds. Slowly return to your starting position. Repeat __________ times. Complete this exercise __________ times per day.  STRETCH - Posterior Shoulder  Capsule   Stand or sit with good posture. Grasp your right / left elbow and draw it across your chest, keeping it at the same height as your shoulder.  Pull your elbow, so your upper arm comes in closer to your chest. Pull until you feel a gentle stretch in the back of your shoulder.  Hold for __________ seconds. Repeat __________ times. Complete this exercise __________ times per day. STRENGTHENING EXERCISES - Impingement Syndrome (Rotator Cuff Tendinitis, Bursitis) These exercises may help you when beginning to rehabilitate your injury. They may resolve your symptoms with or without further involvement from your physician, physical therapist or athletic trainer. While completing these exercises, remember:  Muscles can gain both the endurance and the strength needed for everyday activities through controlled exercises.  Complete these exercises as instructed by your physician, physical therapist or athletic trainer. Increase the resistance and repetitions only as guided.  You may experience muscle soreness or fatigue, but the pain or discomfort you are trying  to eliminate should never worsen during these exercises. If this pain does get worse, stop and make sure you are following the directions exactly. If the pain is still present after adjustments, discontinue the exercise until you can discuss the trouble with your clinician.  During your recovery, avoid activity or exercises which involve actions that place your injured hand or elbow above your head or behind your back or head. These positions stress the tissues which you are trying to heal. STRENGTH - Scapular Depression and Adduction   With good posture, sit on a firm chair. Support your arms in front of you, with pillows, arm rests, or on a table top. Have your elbows in line with the sides of your body.  Gently draw your shoulder blades down and toward your mid-back spine. Gradually increase the tension, without tensing the muscles  along the top of your shoulders and the back of your neck.  Hold for __________ seconds. Slowly release the tension and relax your muscles completely before starting the next repetition.  After you have practiced this exercise, remove the arm support and complete the exercise in standing as well as sitting position. Repeat __________ times. Complete this exercise __________ times per day.  STRENGTH - Shoulder Abductors, Isometric  With good posture, stand or sit about 4-6 inches from a wall, with your right / left side facing the wall.  Bend your right / left elbow. Gently press your right / left elbow into the wall. Increase the pressure gradually, until you are pressing as hard as you can, without shrugging your shoulder or increasing any shoulder discomfort.  Hold for __________ seconds.  Release the tension slowly. Relax your shoulder muscles completely before you begin the next repetition. Repeat __________ times. Complete this exercise __________ times per day.  STRENGTH - External Rotators, Isometric  Keep your right / left elbow at your side and bend it 90 degrees.  Step into a door frame so that the outside of your right / left wrist can press against the door frame without your upper arm leaving your side.  Gently press your right / left wrist into the door frame, as if you were trying to swing the back of your hand away from your stomach. Gradually increase the tension, until you are pressing as hard as you can, without shrugging your shoulder or increasing any shoulder discomfort.  Hold for __________ seconds.  Release the tension slowly. Relax your shoulder muscles completely before you begin the next repetition. Repeat __________ times. Complete this exercise __________ times per day.  STRENGTH - Supraspinatus   Stand or sit with good posture. Grasp a __________ weight, or an exercise band or tubing, so that your hand is "thumbs-up," like you are shaking hands.  Slowly  lift your right / left arm in a "V" away from your thigh, diagonally into the space between your side and straight ahead. Lift your hand to shoulder height or as far as you can, without increasing any shoulder pain. At first, many people do not lift their hands above shoulder height.  Avoid shrugging your right / left shoulder as your arm rises, by keeping your shoulder blade tucked down and toward your mid-back spine.  Hold for __________ seconds. Control the descent of your hand, as you slowly return to your starting position. Repeat __________ times. Complete this exercise __________ times per day.  STRENGTH - External Rotators  Secure a rubber exercise band or tubing to a fixed object (table, pole) so that  it is at the same height as your right / left elbow when you are standing or sitting on a firm surface.  Stand or sit so that the secured exercise band is at your uninjured side.  Bend your right / left elbow 90 degrees. Place a folded towel or small pillow under your right / left arm, so that your elbow is a few inches away from your side.  Keeping the tension on the exercise band, pull it away from your body, as if pivoting on your elbow. Be sure to keep your body steady, so that the movement is coming only from your rotating shoulder.  Hold for __________ seconds. Release the tension in a controlled manner, as you return to the starting position. Repeat __________ times. Complete this exercise __________ times per day.  STRENGTH - Internal Rotators   Secure a rubber exercise band or tubing to a fixed object (table, pole) so that it is at the same height as your right / left elbow when you are standing or sitting on a firm surface.  Stand or sit so that the secured exercise band is at your right / left side.  Bend your elbow 90 degrees. Place a folded towel or small pillow under your right / left arm so that your elbow is a few inches away from your side.  Keeping the tension on the  exercise band, pull it across your body, toward your stomach. Be sure to keep your body steady, so that the movement is coming only from your rotating shoulder.  Hold for __________ seconds. Release the tension in a controlled manner, as you return to the starting position. Repeat __________ times. Complete this exercise __________ times per day.  STRENGTH - Scapular Protractors, Standing   Stand arms length away from a wall. Place your hands on the wall, keeping your elbows straight.  Begin by dropping your shoulder blades down and toward your mid-back spine.  To strengthen your protractors, keep your shoulder blades down, but slide them forward on your rib cage. It will feel as if you are lifting the back of your rib cage away from the wall. This is a subtle motion and can be challenging to complete. Ask your caregiver for further instruction, if you are not sure you are doing the exercise correctly.  Hold for __________ seconds. Slowly return to the starting position, resting the muscles completely before starting the next repetition. Repeat __________ times. Complete this exercise __________ times per day. STRENGTH - Scapular Protractors, Supine  Lie on your back on a firm surface. Extend your right / left arm straight into the air while holding a __________ weight in your hand.  Keeping your head and back in place, lift your shoulder off the floor.  Hold for __________ seconds. Slowly return to the starting position, and allow your muscles to relax completely before starting the next repetition. Repeat __________ times. Complete this exercise __________ times per day. STRENGTH - Scapular Protractors, Quadruped  Get onto your hands and knees, with your shoulders directly over your hands (or as close as you can be, comfortably).  Keeping your elbows locked, lift the back of your rib cage up into your shoulder blades, so your mid-back rounds out. Keep your neck muscles relaxed.  Hold  this position for __________ seconds. Slowly return to the starting position and allow your muscles to relax completely before starting the next repetition. Repeat __________ times. Complete this exercise __________ times per day.  STRENGTH - Scapular Retractors  Secure a rubber exercise band or tubing to a fixed object (table, pole), so that it is at the height of your shoulders when you are either standing, or sitting on a firm armless chair.  With a palm down grip, grasp an end of the band in each hand. Straighten your elbows and lift your hands straight in front of you, at shoulder height. Step back, away from the secured end of the band, until it becomes tense.  Squeezing your shoulder blades together, draw your elbows back toward your sides, as you bend them. Keep your upper arms lifted away from your body throughout the exercise.  Hold for __________ seconds. Slowly ease the tension on the band, as you reverse the directions and return to the starting position. Repeat __________ times. Complete this exercise __________ times per day. STRENGTH - Shoulder Extensors   Secure a rubber exercise band or tubing to a fixed object (table, pole) so that it is at the height of your shoulders when you are either standing, or sitting on a firm armless chair.  With a thumbs-up grip, grasp an end of the band in each hand. Straighten your elbows and lift your hands straight in front of you, at shoulder height. Step back, away from the secured end of the band, until it becomes tense.  Squeezing your shoulder blades together, pull your hands down to the sides of your thighs. Do not allow your hands to go behind you.  Hold for __________ seconds. Slowly ease the tension on the band, as you reverse the directions and return to the starting position. Repeat __________ times. Complete this exercise __________ times per day.  STRENGTH - Scapular Retractors and External Rotators   Secure a rubber exercise  band or tubing to a fixed object (table, pole) so that it is at the height as your shoulders, when you are either standing, or sitting on a firm armless chair.  With a palm down grip, grasp an end of the band in each hand. Bend your elbows 90 degrees and lift your elbows to shoulder height, at your sides. Step back, away from the secured end of the band, until it becomes tense.  Squeezing your shoulder blades together, rotate your shoulders so that your upper arms and elbows remain stationary, but your fists travel upward to head height.  Hold for __________ seconds. Slowly ease the tension on the band, as you reverse the directions and return to the starting position. Repeat __________ times. Complete this exercise __________ times per day.  STRENGTH - Scapular Retractors and External Rotators, Rowing   Secure a rubber exercise band or tubing to a fixed object (table, pole) so that it is at the height of your shoulders, when you are either standing, or sitting on a firm armless chair.  With a palm down grip, grasp an end of the band in each hand. Straighten your elbows and lift your hands straight in front of you, at shoulder height. Step back, away from the secured end of the band, until it becomes tense.  Step 1: Squeeze your shoulder blades together. Bending your elbows, draw your hands to your chest, as if you are rowing a boat. At the end of this motion, your hands and elbow should be at shoulder height and your elbows should be out to your sides.  Step 2: Rotate your shoulders, to raise your hands above your head. Your forearms should be vertical and your upper arms should be horizontal.  Hold for __________ seconds.   Slowly ease the tension on the band, as you reverse the directions and return to the starting position. Repeat __________ times. Complete this exercise __________ times per day.  STRENGTH - Scapular Depressors  Find a sturdy chair without wheels, such as a dining room  chair.  Keeping your feet on the floor, and your hands on the chair arms, lift your bottom up from the seat, and lock your elbows.  Keeping your elbows straight, allow gravity to pull your body weight down. Your shoulders will rise toward your ears.  Raise your body against gravity by drawing your shoulder blades down your back, shortening the distance between your shoulders and ears. Although your feet should always maintain contact with the floor, your feet should progressively support less body weight, as you get stronger.  Hold for __________ seconds. In a controlled and slow manner, lower your body weight to begin the next repetition. Repeat __________ times. Complete this exercise __________ times per day.    This information is not intended to replace advice given to you by your health care provider. Make sure you discuss any questions you have with your health care provider.   Document Released: 09/29/2005 Document Revised: 10/20/2014 Document Reviewed: 01/11/2009 [employer] Interactive Patient Education [employer].

## 2016-08-18 NOTE — Progress Notes (Signed)
Julie Burton is a 55 y.o. female who presents to [facility] [place]: [place] today for follow-up anemia and gastritis.  Anemia: Patient was diagnosed with significant iron deficiency and anemia previously. She took iron twice daily until today. She's feeling much better with less fatigue. She continues to have heavy menstrual periods and has a follow-up appointment scheduled with OB/GYN later this month.  Gastritis: Patient additionally was diagnosed with gastritis and treated with Carafate and twice daily Protonix. She feels much better with no gastritis symptoms. She continues the Protonix twice daily and has discontinued the Carafate.  Left shoulder pain: Patient notes left neck and shoulder pain and left hand numbness. This is been ongoing for years. Previously she has been diagnosed with carpal tunnel syndrome with a moderately positive nerve conduction study. Symptoms occur at night with left shoulder pain and numbness in the morning into the left hand,.  The symptoms are moderate. 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   Past Medical History:  Diagnosis Date  . Arthritis   . GERD (gastroesophageal reflux disease)    Past Surgical History:  Procedure Laterality Date  . BUNIONECTOMY  2008  . CESAREAN SECTION    . TONSILLECTOMY    . TUBAL LIGATION     Social History  Substance Use Topics  . Smoking status: Never Smoker  . Smokeless tobacco: Not on file  . Alcohol use No   family history includes COPD in her mother; Cancer in her mother; Depression in her mother; Diabetes in her mother; Heart disease in her mother and paternal grandfather; Hyperlipidemia in her mother; Hypertension in her mother; Miscarriages / Stillbirths in her paternal grandmother.  ROS as above:  Medications: Current Outpatient Prescriptions    Medication Sig Dispense Refill  . CycloSPORINE (RESTASIS OP) Apply to eye.    . diclofenac sodium (VOLTAREN) 1 % GEL Apply 2 g topically 4 (four) times daily. To affected joint. 100 g 11  . DULoxetine (CYMBALTA) 30 MG capsule TAKE 1 CAPSULE BY MOUTH ONCE DAILY 90 capsule 1  . KRILL OIL PO Take by mouth.    . Naproxen Sodium (ALEVE PO) Take by mouth.    . pantoprazole (PROTONIX) 40 MG tablet Take 1 tablet (40 mg total) by mouth 2 (two) times daily. 60 tablet 3   No current facility-administered medications for this visit.    No Known Allergies  Health Maintenance Health Maintenance  Topic Date Due  . INFLUENZA VACCINE  05/13/2016  . MAMMOGRAM  02/28/2017  . PAP SMEAR  02/14/2019  . TETANUS/TDAP  01/11/2022  . COLONOSCOPY  07/31/2022  . Hepatitis C Screening  Completed  . HIV Screening  Completed     Exam:  BP 125/71   Pulse 72   Wt 137 lb (62.1 kg)   BMI 25.89 kg/m  Gen: Well NAD HEENT: EOMI,  MMM Lungs: Normal work of breathing. CTABL Heart: RRR no MRG Abd: NABS, Soft. Nondistended, Nontender Exts: Brisk capillary refill, warm and well perfused.  C-spine: Nontender to midline normal neck motion over patient has pain with left rotation and left lateral flexion and has a positive left-sided Spurling's test. Shoulder normal motion and nontender over positive Hawkins and empty can test. Negative Yergason's and speeds test. Wrist: Left wrist positive Tinel's.  Left hand intact thenar muscles. Normal grip strength sensation and capillary refill.    Lab Results  Component Value Date   IRON 192 (H) 07/17/2016   TIBC 377 07/17/2016   FERRITIN 30 07/17/2016     Chemistry      Component Value Date/Time   NA 141 07/17/2016 0958   K 4.5 07/17/2016 0958   CL 107 07/17/2016 0958   CO2 27 07/17/2016 0958   BUN 9 07/17/2016 0958   CREATININE 0.91 07/17/2016 0958      Component Value Date/Time   CALCIUM 9.7 07/17/2016 0958   ALKPHOS 63 07/17/2016 0958   AST 21  07/17/2016 0958   ALT 17 07/17/2016 0958   BILITOT 0.5 07/17/2016 0958        No results found for this or any previous visit (from the past 72 hour(s)). No results found.    Assessment and Plan: 55 y.o. female with   Iron deficiency anemia: Corrected. Follow-up with OB/GYN and discontinue twice daily iron. Recheck with normal over-the-counter multivitamin iron replacement in about 6 months.  Gastritis: Doing quite well. Discontinue Carafate. Transition to once daily Protonix. Avoid NSAIDs if possible   Left shoulder pain: Combination of cervical arthritis, possible left cervical radiculopathy, left shoulder rotator cuff tendinitis/impingement, and probable left carpal tunnel syndrome. Discussed some home exercise program. Patient would like to avoid intensive treatment of possible as her symptoms are moderate. 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    No orders of the defined types were placed in this encounter.   Discussed warning signs or symptoms. Please see discharge instructions. Patient expresses understanding.

## 2016-09-09 ENCOUNTER — Ambulatory Visit: Payer: 59 | Admitting: Obstetrics & Gynecology

## 2016-10-01 ENCOUNTER — Encounter (INDEPENDENT_AMBULATORY_CARE_PROVIDER_SITE_OTHER): Payer: Self-pay

## 2016-10-01 ENCOUNTER — Ambulatory Visit (INDEPENDENT_AMBULATORY_CARE_PROVIDER_SITE_OTHER): Payer: 59 | Admitting: Obstetrics & Gynecology

## 2016-10-01 ENCOUNTER — Encounter: Payer: Self-pay | Admitting: Obstetrics & Gynecology

## 2016-10-01 VITALS — BP 119/77 | HR 81 | Ht 61.0 in | Wt 141.0 lb

## 2016-10-01 DIAGNOSIS — N939 Abnormal uterine and vaginal bleeding, unspecified: Secondary | ICD-10-CM | POA: Diagnosis not present

## 2016-10-01 DIAGNOSIS — N920 Excessive and frequent menstruation with regular cycle: Secondary | ICD-10-CM

## 2016-10-02 ENCOUNTER — Other Ambulatory Visit: Payer: Self-pay | Admitting: Family Medicine

## 2016-10-02 MED FILL — PANTOPRAZOLE SOD DR 40 MG T: 40 | 30 days supply | Qty: 60 | Fill #1

## 2016-10-02 NOTE — Progress Notes (Signed)
   Subjective:    Patient ID: Julie Burton, female    DOB: [birth year], 55 y.o.   MRN: [id number]  HPI Pt presents for menorrhagia.  No bleeding between periods.  Menarche 14 Regular monthly menses until mid 69s.  Became heavier and worse in 21s. Still bleeding once a month but very heavy.  Uses both super tampons (sometimes 2) and pads.  Pt has not taken any medications to help with bleeding.  Pt does have anemia.  Review of Systems  Constitutional: Negative.   Respiratory: Negative.   Cardiovascular: Negative.   Gastrointestinal: Negative.   Endocrine: Negative.   Genitourinary: Positive for menstrual problem and vaginal bleeding.       Objective:   Physical Exam  Constitutional: She is oriented to person, place, and time. She appears well-developed and well-nourished. No distress.  HENT:  Head: Normocephalic and atraumatic.  Eyes: Conjunctivae are normal.  Pulmonary/Chest: Effort normal.  Abdominal: Soft. There is no tenderness.  Genitourinary: Vagina normal and uterus normal.  Musculoskeletal: She exhibits no edema.  Neurological: She is alert and oriented to person, place, and time.  Skin: Skin is warm and dry.  Psychiatric: She has a normal mood and affect.  Vitals reviewed.  Vitals:   10/01/16 0910  BP: 119/77  Pulse: 81  Weight: 141 lb (64 kg)  Height: 5\' 1"  (1.549 m)    Assessment & Plan:  55 yo female with menorrhagia  1-TVUS 2-Endometrial biopsy  ENDOMETRIAL BIOPSY     The indications for endometrial biopsy were reviewed.   Risks of the biopsy including cramping, bleeding, infection, uterine perforation, inadequate specimen and need for additional procedures  were discussed. The patient states she understands and agrees to undergo procedure today. Consent was signed. Time out was performed.  A sterile speculum was placed in the patient's vagina and the cervix was prepped with Betadine. A single-toothed tenaculum was placed on the anterior lip of the  cervix to stabilize it. The 3 mm pipelle was introduced into the endometrial cavity without difficulty to a depth of 8cm, and a moderate amount of tissue was obtained and sent to pathology. The instruments were removed from the patient's vagina. Minimal bleeding from the cervix was noted. The patient tolerated the procedure well. Routine post-procedure instructions were given to the patient. The patient will follow up to review the results and for further management.

## 2016-10-03 MED FILL — DULoxetine HCL 30 MG CPEP: 30 | 90 days supply | Qty: 90 | Fill #0

## 2016-10-10 ENCOUNTER — Telehealth: Payer: Self-pay | Admitting: *Deleted

## 2016-10-10 NOTE — Telephone Encounter (Signed)
-----   Message from Guss Bunde, MD sent at 10/10/2016  6:15 AM EST ----- Benign endometrial biopsy.  Pt US TVUS in January.  Will need f/u appt to discuss results and plan.

## 2016-10-10 NOTE — Telephone Encounter (Signed)
Pt notified of benign pathology.  She has a TVU scheduled for 10/18/15.  Aptt made for F/U  With Dr Gala Romney 10/22/16 @ 9:30

## 2016-10-17 ENCOUNTER — Ambulatory Visit (HOSPITAL_COMMUNITY)
Admission: RE | Admit: 2016-10-17 | Discharge: 2016-10-17 | Disposition: A | Discharge status: Home / Self Care | Payer: 59 | Source: Ambulatory Visit | Attending: Obstetrics & Gynecology | Admitting: Obstetrics & Gynecology

## 2016-10-17 DIAGNOSIS — N920 Excessive and frequent menstruation with regular cycle: Secondary | ICD-10-CM | POA: Insufficient documentation

## 2016-10-22 ENCOUNTER — Ambulatory Visit: Payer: 59 | Admitting: Obstetrics & Gynecology

## 2016-11-03 DIAGNOSIS — H5211 Myopia, right eye: Secondary | ICD-10-CM | POA: Diagnosis not present

## 2016-11-03 DIAGNOSIS — H04123 Dry eye syndrome of bilateral lacrimal glands: Secondary | ICD-10-CM | POA: Diagnosis not present

## 2016-11-03 DIAGNOSIS — D3141 Benign neoplasm of right ciliary body: Secondary | ICD-10-CM | POA: Diagnosis not present

## 2016-11-03 DIAGNOSIS — H524 Presbyopia: Secondary | ICD-10-CM | POA: Diagnosis not present

## 2016-11-03 DIAGNOSIS — H52223 Regular astigmatism, bilateral: Secondary | ICD-10-CM | POA: Diagnosis not present

## 2017-01-02 MED FILL — PANTOPRAZOLE SOD DR 40 MG T: 40 | 30 days supply | Qty: 60 | Fill #2

## 2017-01-02 MED FILL — DULoxetine HCL 30 MG CPEP: 30 | 90 days supply | Qty: 90 | Fill #1

## 2017-02-16 MED FILL — PANTOPRAZOLE SOD DR 40 MG T: 40 | 30 days supply | Qty: 60 | Fill #3

## 2017-03-10 ENCOUNTER — Encounter: Payer: 59 | Admitting: Family Medicine

## 2017-03-18 ENCOUNTER — Ambulatory Visit: Payer: 59 | Admitting: Obstetrics & Gynecology

## 2017-03-18 DIAGNOSIS — Z09 Encounter for follow-up examination after completed treatment for conditions other than malignant neoplasm: Secondary | ICD-10-CM

## 2017-03-31 ENCOUNTER — Ambulatory Visit (INDEPENDENT_AMBULATORY_CARE_PROVIDER_SITE_OTHER): Payer: 59 | Admitting: Family Medicine

## 2017-03-31 ENCOUNTER — Encounter: Payer: Self-pay | Admitting: Family Medicine

## 2017-03-31 VITALS — BP 116/67 | HR 76 | Ht 61.0 in | Wt 139.0 lb

## 2017-03-31 DIAGNOSIS — Z0000 Encounter for general adult medical examination without abnormal findings: Secondary | ICD-10-CM

## 2017-03-31 DIAGNOSIS — K581 Irritable bowel syndrome with constipation: Secondary | ICD-10-CM

## 2017-03-31 DIAGNOSIS — R7303 Prediabetes: Secondary | ICD-10-CM

## 2017-03-31 DIAGNOSIS — E559 Vitamin D deficiency, unspecified: Secondary | ICD-10-CM | POA: Diagnosis not present

## 2017-03-31 DIAGNOSIS — D500 Iron deficiency anemia secondary to blood loss (chronic): Secondary | ICD-10-CM | POA: Diagnosis not present

## 2017-03-31 DIAGNOSIS — K589 Irritable bowel syndrome without diarrhea: Secondary | ICD-10-CM | POA: Insufficient documentation

## 2017-03-31 DIAGNOSIS — K2100 Gastro-esophageal reflux disease with esophagitis, without bleeding: Secondary | ICD-10-CM

## 2017-03-31 HISTORY — DX: Irritable bowel syndrome, unspecified: K58.9

## 2017-03-31 LAB — LIPID PANEL W/REFLEX DIRECT LDL
CHOLESTEROL: 246 mg/dL — AB (ref <200)
HDL: 61 mg/dL (ref >50)
LDL-CHOLESTEROL: 162 mg/dL — AB
Non-HDL Cholesterol (Calc): 185 mg/dL — ABNORMAL HIGH (ref <130)
TRIGLYCERIDES: 112 mg/dL (ref <150)
Total CHOL/HDL Ratio: 4 Ratio (ref <5.0)

## 2017-03-31 LAB — CBC
HEMATOCRIT: 39.4 % (ref 35.0–45.0)
HEMOGLOBIN: 12.8 g/dL (ref 11.7–15.5)
MCH: 29.6 pg (ref 27.0–33.0)
MCHC: 32.5 g/dL (ref 32.0–36.0)
MCV: 91 fL (ref 80.0–100.0)
MPV: 10.2 fL (ref 7.5–12.5)
Platelets: 359 10*3/uL (ref 140–400)
RBC: 4.33 MIL/uL (ref 3.80–5.10)
RDW: 14.4 % (ref 11.0–15.0)
WBC: 5.1 10*3/uL (ref 3.8–10.8)

## 2017-03-31 LAB — COMPLETE METABOLIC PANEL WITH GFR
ALT: 16 U/L (ref 6–29)
AST: 18 U/L (ref 10–35)
Albumin: 4.3 g/dL (ref 3.6–5.1)
Alkaline Phosphatase: 64 U/L (ref 33–130)
BILIRUBIN TOTAL: 0.5 mg/dL (ref 0.2–1.2)
BUN: 10 mg/dL (ref 7–25)
CALCIUM: 9.7 mg/dL (ref 8.6–10.4)
CO2: 24 mmol/L (ref 20–31)
Chloride: 105 mmol/L (ref 98–110)
Creat: 0.89 mg/dL (ref 0.50–1.05)
GFR, EST AFRICAN AMERICAN: 85 mL/min (ref >=60)
GFR, Est Non African American: 74 mL/min (ref >=60)
Glucose, Bld: 90 mg/dL (ref 65–99)
Potassium: 4.2 mmol/L (ref 3.5–5.3)
Sodium: 139 mmol/L (ref 135–146)
TOTAL PROTEIN: 7.1 g/dL (ref 6.1–8.1)

## 2017-03-31 LAB — IRON AND TIBC
%SAT: 17 % (ref 11–50)
IRON: 63 ug/dL (ref 45–160)
TIBC: 371 ug/dL (ref 250–450)
UIBC: 308 ug/dL

## 2017-03-31 MED ORDER — DULOXETINE HCL 30 MG PO CPEP: 30.0000 mg | ORAL_CAPSULE | Freq: Every day | ORAL | 3 refills | Status: DC

## 2017-03-31 MED ORDER — LINACLOTIDE 145 MCG PO CAPS: 145.0000 ug | ORAL_CAPSULE | Freq: Every day | ORAL | 0 refills | Status: DC

## 2017-03-31 MED ORDER — PANTOPRAZOLE SODIUM 40 MG PO TBEC: 40.0000 mg | DELAYED_RELEASE_TABLET | Freq: Every day | ORAL | 3 refills | Status: DC

## 2017-03-31 MED FILL — PANTOPRAZOLE SOD DR 40 MG T: 40 | 90 days supply | Qty: 90 | Fill #0

## 2017-03-31 MED FILL — DULoxetine HCL 30 MG CPEP: 30 | 90 days supply | Qty: 90 | Fill #0

## 2017-03-31 NOTE — Patient Instructions (Addendum)
Thank you for coming in today. Get fasting labs today.  Recheck yearly or sooner if needed.  We will do a trial of Linzess.  Take the 145mg  dose daily.  Increase to 290 daily if not effective.  Let me know how you feel.   Research Shingrix vaccine.   Follow up with Dr Gala Romney.   Linaclotide oral capsules What is this medicine? LINACLOTIDE (lin a KLOE tide) is used to treat irritable bowel syndrome (IBS) with constipation as the main problem. It may also be used for relief of chronic constipation. This medicine may be used for other purposes; ask your health care provider or pharmacist if you have questions. COMMON BRAND NAME(S): Linzess What should I tell my health care provider before I take this medicine? They need to know if you have any of these conditions: -history of stool (fecal) impaction -now have diarrhea or have diarrhea often -other medical condition -stomach or intestinal disease, including bowel obstruction or abdominal adhesions -an unusual or allergic reaction to linaclotide, other medicines, foods, dyes, or preservatives -pregnant or trying to get pregnant -breast-feeding How should I use this medicine? Take this medicine by mouth with a glass of water. Follow the directions on the prescription label. Do not cut, crush or chew this medicine. Take on an empty stomach, at least 30 minutes before your first meal of the day. Take your medicine at regular intervals. Do not take your medicine more often than directed. Do not stop taking except on your doctor's advice. A special MedGuide will be given to you by the pharmacist with each prescription and refill. Be sure to read this information carefully each time. Talk to your pediatrician regarding the use of this medicine in children. This medicine is not approved for use in children. Overdosage: If you think you have taken too much of this medicine contact a poison control center or emergency room at once. NOTE: This  medicine is only for you. Do not share this medicine with others. What if I miss a dose? If you miss a dose, just skip that dose. Wait until your next dose, and take only that dose. Do not take double or extra doses. What may interact with this medicine? -certain medicines for bowel problems or bladder incontinence (these can cause constipation) This list may not describe all possible interactions. Give your health care provider a list of all the medicines, herbs, non-prescription drugs, or dietary supplements you use. Also tell them if you smoke, drink alcohol, or use illegal drugs. Some items may interact with your medicine. What should I watch for while using this medicine? Visit your doctor for regular check ups. Tell your doctor if your symptoms do not get better or if they get worse. Diarrhea is a common side effect of this medicine. It often begins within 2 weeks of starting this medicine. Stop taking this medicine and call your doctor if you get severe diarrhea. Stop taking this medicine and call your doctor or go to the nearest hospital emergency room right away if you develop unusual or severe stomach-area (abdominal) pain, especially if you also have bright red, bloody stools or black stools that look like tar. What side effects may I notice from receiving this medicine? Side effects that you should report to your doctor or health care professional as soon as possible: -allergic reactions like skin rash, itching or hives, swelling of the face, lips, or tongue -black, tarry stools -bloody or watery diarrhea -new or worsening stomach pain -severe or  prolonged diarrhea Side effects that usually do not require medical attention (report to your doctor or health care professional if they continue or are bothersome): -bloating -gas -loose stools This list may not describe all possible side effects. Call your doctor for medical advice about side effects. You may report side effects to FDA at  1-800-FDA-1088. Where should I keep my medicine? Keep out of the reach of children. Store at room temperature between 20 and 25 degrees C (68 and 77 degrees F). Keep this medicine in the original container. Keep tightly closed in a dry place. Do not remove the desiccant packet from the bottle, it helps to protect your medicine from moisture. Throw away any unused medicine after the expiration date. NOTE: This sheet is a summary. It may not cover all possible information. If you have questions about this medicine, talk to your doctor, pharmacist, or health care provider.  2018 Elsevier/Gold Standard (2015-11-01 12:17:04)

## 2017-03-31 NOTE — Progress Notes (Signed)
Julie Burton is a 56 y.o. female who presents to [place]: [place] today for well adult visit.   Julie Burton is doing well. She is tolerating the medications prescribed including duloxetine  Krill oil vitamin D occasional naproxen and protonix. She is receiving routine gynecologic care. She notes that she is getting a mammogram next week. She feels well. She denies any depression symptoms. She notes that she is able to get some exercise weekly. She estimates that she walks at least 30 minutes a day with moderate intensity 3 or 4 days per week. 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  She does however note frequent abdominal discomfort. She notes a past history of IBS constipation type. She is taking probiotics as well as a high-fiber diet. She notes continued constipation and left abdominal discomfort. She notes that she has a normal colonoscopy relatively recently in the last several years. She also notes that on a recent gynecologic exam she was having bulging at the inferior wall of her vagina attributable to rectal distention and pelvic floor weakness. She has a follow-up appointment with her OB/GYN doctor soon.   Past Medical History:  Diagnosis Date  . Arthritis   . GERD (gastroesophageal reflux disease)   . IBS (irritable colon syndrome) 03/31/2017   Constipation dominant   Past Surgical History:  Procedure Laterality Date  . BUNIONECTOMY  2008  . CESAREAN SECTION    . TONSILLECTOMY    . TUBAL LIGATION     Social History  Substance Use Topics  . Smoking status: Never Smoker  . Smokeless tobacco: Never Used  . Alcohol use No   family history includes COPD in her mother; Cancer in her mother; Depression in her mother; Diabetes in her mother; Heart disease in her mother and paternal grandfather; Hyperlipidemia in her mother; Hypertension in her mother;  Miscarriages / Stillbirths in her paternal grandmother.  ROS as above:  Medications: Current Outpatient Prescriptions  Medication Sig Dispense Refill  . diclofenac sodium (VOLTAREN) 1 % GEL Apply 2 g topically 4 (four) times daily. To affected joint. (Patient not taking: Reported on 10/01/2016) 100 g 11  . DULoxetine (CYMBALTA) 30 MG capsule Take 1 capsule (30 mg total) by mouth daily. 90 capsule 3  . KRILL OIL PO Take by mouth.    . linaclotide (LINZESS) 145 MCG CAPS capsule Take 1 capsule (145 mcg total) by mouth daily. 30 capsule 0  . Naproxen Sodium (ALEVE PO) Take by mouth.    . pantoprazole (PROTONIX) 40 MG tablet Take 1 tablet (40 mg total) by mouth daily. 90 tablet 3   No current facility-administered medications for this visit.    No Known Allergies  Health Maintenance Health Maintenance  Topic Date Due  . MAMMOGRAM  02/28/2017  . INFLUENZA VACCINE  05/13/2017  . PAP SMEAR  02/14/2019  . TETANUS/TDAP  01/11/2022  . COLONOSCOPY  07/31/2022  . Hepatitis C Screening  Completed  . HIV Screening  Completed     Exam:  BP 116/67   Pulse 76   Ht 5\' 1"  (1.549 m)   Wt 139 lb (63 kg)   BMI 26.26 kg/m  Gen: Well NAD HEENT: EOMI,  MMM Lungs: Normal work of breathing. CTABL Heart: RRR no MRG Abd: NABS, Soft. Nondistended, Nontender Exts: Brisk capillary refill, warm and well perfused.  Skin: No concerning skin lesions.   No results found for this or  any previous visit (from the past 72 hour(s)). No results found.    Assessment and Plan: 56 y.o. female with  Well adult. Doing reasonably well. We'll check basic fasting labs for routine checks as well as to follow-up history of anemia and vitamin D deficiency. We'll add on a bone density test to her mammogram as she has a history of vitamin D deficiency.  As for her constipation she is failing conservative management. She has tried MiraLAX as well as probiotics and fiber. Will try using Linzess. One-month trial if  doing well will continue indefinitely.  Appreciate OB/GYN recommendations regarding likely pelvic floor weakness.   Orders Placed This Encounter  Procedures  . DG Bone Density    Standing Status:   Future    Standing Expiration Date:   06/01/2018    Scheduling Instructions:     [facility]    Order Specific Question:   Reason for exam:    Answer:   eval bone density    Order Specific Question:   Preferred imaging location?    Answer:   External  . CBC  . COMPLETE METABOLIC PANEL WITH GFR  . Lipid Panel w/reflex Direct LDL  . VITAMIN D 25 Hydroxy (Vit-D Deficiency, Fractures)  . Hemoglobin A1c  . Iron and TIBC  . Ferritin   Meds ordered this encounter  Medications  . pantoprazole (PROTONIX) 40 MG tablet    Sig: Take 1 tablet (40 mg total) by mouth daily.    Dispense:  90 tablet    Refill:  3  . DISCONTD: DULoxetine (CYMBALTA) 30 MG capsule    Sig: Take 1 capsule (30 mg total) by mouth daily.    Dispense:  90 capsule    Refill:  3  . DULoxetine (CYMBALTA) 30 MG capsule    Sig: Take 1 capsule (30 mg total) by mouth daily.    Dispense:  90 capsule    Refill:  3  . linaclotide (LINZESS) 145 MCG CAPS capsule    Sig: Take 1 capsule (145 mcg total) by mouth daily.    Dispense:  30 capsule    Refill:  0    Pt tried and failed OTC probiotics, fiber and miralax     Discussed warning signs or symptoms. Please see discharge instructions. Patient expresses understanding.  CC: Dr Gala Romney

## 2017-04-01 LAB — FERRITIN: Ferritin: 18 ng/mL (ref 10–232)

## 2017-04-01 LAB — HEMOGLOBIN A1C
Hgb A1c MFr Bld: 5.5 % (ref <5.7)
Mean Plasma Glucose: 111 mg/dL

## 2017-04-01 LAB — VITAMIN D 25 HYDROXY (VIT D DEFICIENCY, FRACTURES): Vit D, 25-Hydroxy: 33 ng/mL (ref 30–100)

## 2017-04-14 DIAGNOSIS — Z803 Family history of malignant neoplasm of breast: Secondary | ICD-10-CM | POA: Diagnosis not present

## 2017-04-14 DIAGNOSIS — R921 Mammographic calcification found on diagnostic imaging of breast: Secondary | ICD-10-CM | POA: Diagnosis not present

## 2017-04-14 DIAGNOSIS — Z13820 Encounter for screening for osteoporosis: Secondary | ICD-10-CM | POA: Diagnosis not present

## 2017-04-14 LAB — HM DEXA SCAN

## 2017-04-14 LAB — HM MAMMOGRAPHY

## 2017-04-23 ENCOUNTER — Encounter: Payer: Self-pay | Admitting: Family Medicine

## 2017-04-30 ENCOUNTER — Encounter: Payer: Self-pay | Admitting: Family Medicine

## 2017-07-02 MED FILL — DULoxetine HCL 30 MG CPEP: 30 | 90 days supply | Qty: 90 | Fill #1

## 2017-07-11 IMAGING — CR DG PELVIS 1-2V
1 series · 1 of 1 positions shown · non-contrast
Comparison: None.

CLINICAL DATA: Back pain.  No known injury.  Initial evaluation.

EXAM:
PELVIS - 1-2 VIEW

[pelvis ap]
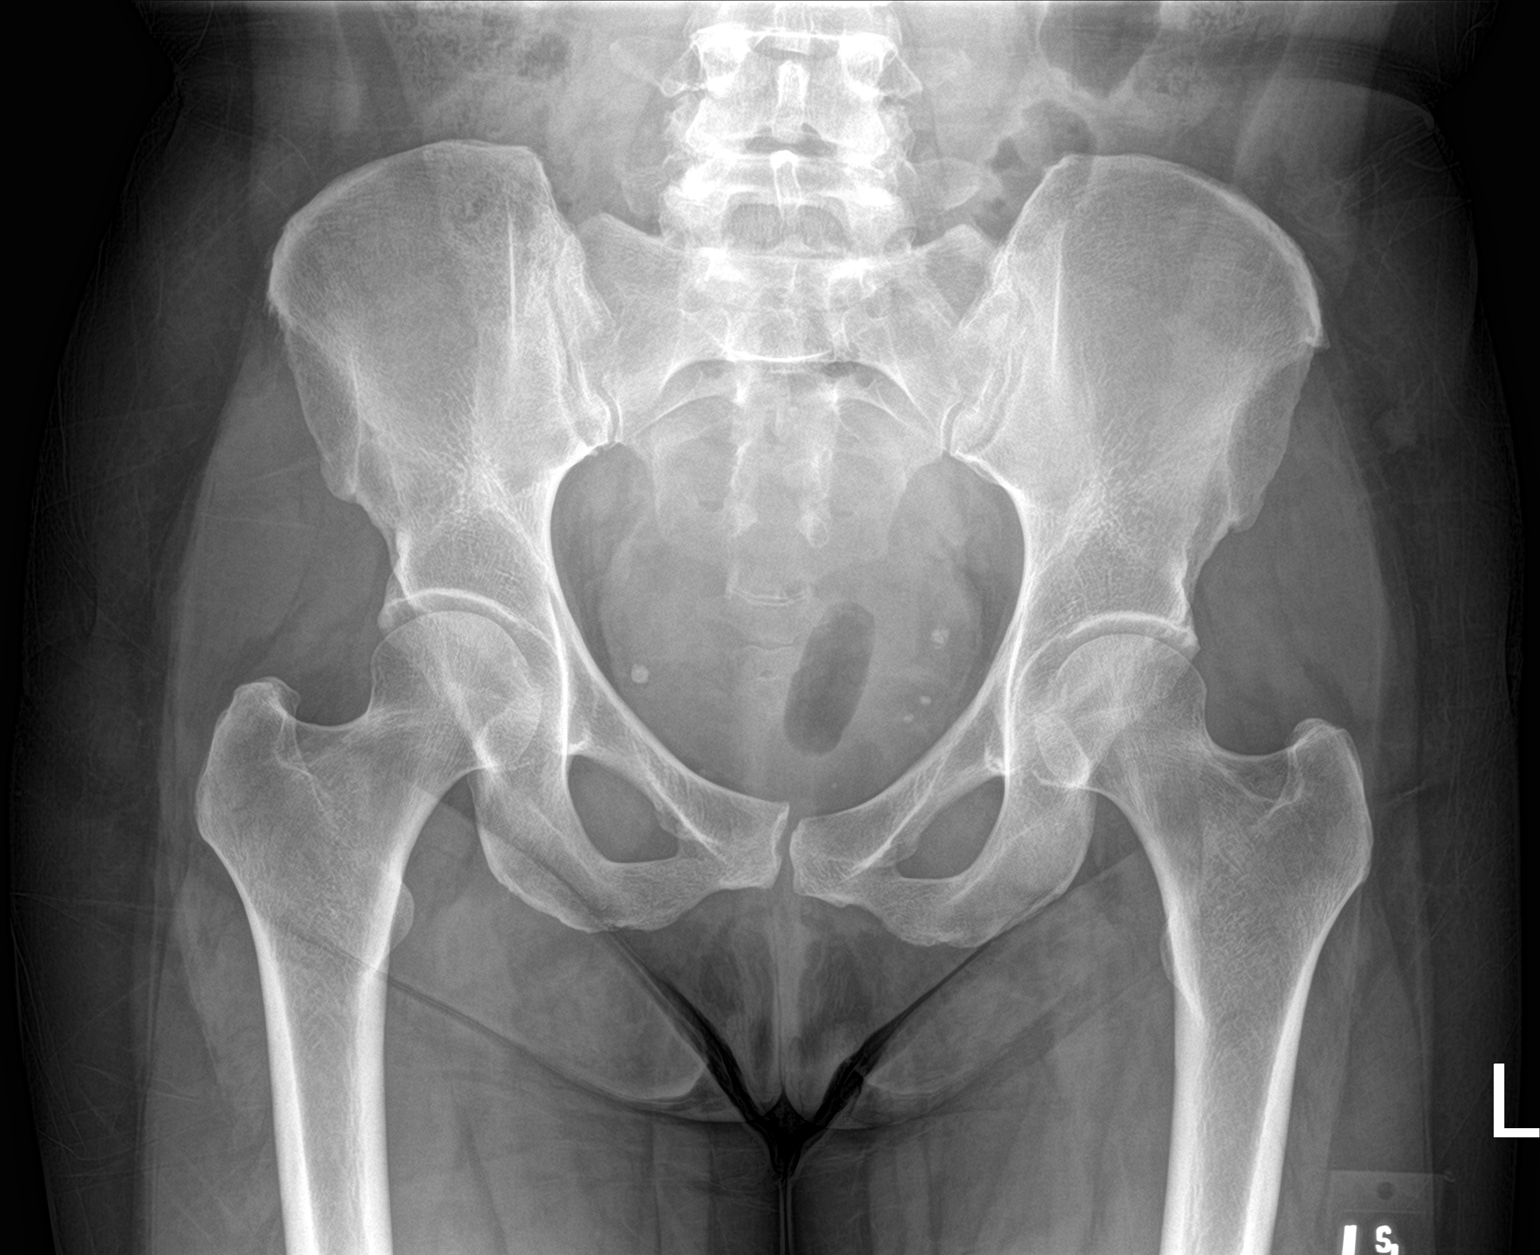

[1 of 1 positions shown; findings below may reference images not displayed]

FINDINGS: Mild degenerative change lumbar spine and both hips. No acute
abnormality. Pelvic calcifications consistent phleboliths .
IMPRESSION: No acute abnormality .

## 2017-08-17 MED FILL — PANTOPRAZOLE SOD DR 40 MG T: 40 | 90 days supply | Qty: 90 | Fill #1

## 2017-09-21 ENCOUNTER — Telehealth: Payer: 59 | Admitting: Family

## 2017-09-21 DIAGNOSIS — B9689 Other specified bacterial agents as the cause of diseases classified elsewhere: Secondary | ICD-10-CM | POA: Diagnosis not present

## 2017-09-21 DIAGNOSIS — J028 Acute pharyngitis due to other specified organisms: Secondary | ICD-10-CM

## 2017-09-21 MED ORDER — PREDNISONE 5 MG PO TABS: 5.0000 mg | ORAL_TABLET | ORAL | 0 refills | Status: DC

## 2017-09-21 MED ORDER — BENZONATATE 100 MG PO CAPS: 100.0000 mg | ORAL_CAPSULE | Freq: Three times a day (TID) | ORAL | 0 refills | Status: DC | PRN

## 2017-09-21 MED ORDER — AZITHROMYCIN 250 MG PO TABS: ORAL_TABLET | ORAL | 0 refills | Status: DC

## 2017-09-21 MED FILL — predniSONE 5 MG TABS: 5 | 6 days supply | Qty: 21 | Fill #0

## 2017-09-21 MED FILL — AZITHROMYCIN 250 MG TABLET: 250 | 5 days supply | Qty: 6 | Fill #0

## 2017-09-21 MED FILL — BENZONATATE 100 MG CAPS: 100 | 5 days supply | Qty: 30 | Fill #0

## 2017-09-21 NOTE — Progress Notes (Signed)

## 2017-09-22 MED FILL — DULoxetine HCL 30 MG CPEP: 30 | 90 days supply | Qty: 90 | Fill #2

## 2017-11-17 MED FILL — PANTOPRAZOLE SOD DR 40 MG T: 40 | 90 days supply | Qty: 90 | Fill #2

## 2017-12-14 ENCOUNTER — Ambulatory Visit (INDEPENDENT_AMBULATORY_CARE_PROVIDER_SITE_OTHER): Payer: 59

## 2017-12-14 ENCOUNTER — Ambulatory Visit (INDEPENDENT_AMBULATORY_CARE_PROVIDER_SITE_OTHER): Payer: 59 | Admitting: Family Medicine

## 2017-12-14 ENCOUNTER — Encounter: Payer: Self-pay | Admitting: Family Medicine

## 2017-12-14 VITALS — BP 136/74 | HR 71 | Temp 98.2°F | Ht 61.0 in | Wt 144.0 lb

## 2017-12-14 DIAGNOSIS — K2100 Gastro-esophageal reflux disease with esophagitis, without bleeding: Secondary | ICD-10-CM

## 2017-12-14 DIAGNOSIS — R05 Cough: Secondary | ICD-10-CM

## 2017-12-14 DIAGNOSIS — R7303 Prediabetes: Secondary | ICD-10-CM | POA: Diagnosis not present

## 2017-12-14 DIAGNOSIS — E559 Vitamin D deficiency, unspecified: Secondary | ICD-10-CM | POA: Diagnosis not present

## 2017-12-14 DIAGNOSIS — D500 Iron deficiency anemia secondary to blood loss (chronic): Secondary | ICD-10-CM

## 2017-12-14 DIAGNOSIS — R059 Cough, unspecified: Secondary | ICD-10-CM

## 2017-12-14 MED ORDER — IPRATROPIUM BROMIDE 0.06 % NA SOLN: 2.0000 | NASAL | 6 refills | Status: DC | PRN

## 2017-12-14 MED ORDER — PANTOPRAZOLE SODIUM 40 MG PO TBEC: 40.0000 mg | DELAYED_RELEASE_TABLET | Freq: Two times a day (BID) | ORAL | 3 refills | Status: DC

## 2017-12-14 MED ORDER — ALBUTEROL SULFATE HFA 108 (90 BASE) MCG/ACT IN AERS
2.0000 | INHALATION_SPRAY | Freq: Four times a day (QID) | RESPIRATORY_TRACT | 0 refills | Status: AC | PRN
Start: 2017-12-14 — End: ?

## 2017-12-14 MED FILL — VENTOLIN HFA 90 MCG INHALER: 108 (90 BAS | 25 days supply | Qty: 18 | Fill #0

## 2017-12-14 MED FILL — IPRATROPIUM 0.06% SPRAY: 0.06 | 14 days supply | Qty: 15 | Fill #0

## 2017-12-14 NOTE — Progress Notes (Signed)
Julie Burton is a 57 y.o. female who presents to [place]: [place] today for cough, fatigue, GERD, and follow up anemia.   Cough: Akiera notes a persistent cough for 3 months. She has had two episodes of viral URI during the 3 month period. She notes that the cough occurs during the day as well as at night. She notes that the cough is worse when her GERD is bothering her more. She denies a personal history of asthma but notes that her mother and sister both have a history of asthma. She has tried over the counter medicine for cough which have not helped much. She has not tried any albuterol.   Fatigue: Solymar notes worsening fatigue over the last several months.  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  She had she is sleeping about 5-6 hours a night and thinks is probably not enough.  She is concerned about the possibility of thyroid disease that she does note some skin hair or nail changes.  She denies any personal history of hypothyroidism.  She does note a personal history of prediabetes and iron deficiency anemia and vitamin D deficiency.  She wonders if these could possibly relate to her fatigue.  Acid reflux: [name] notes bothersome acid reflux despite tonics 40 mg daily.  She notes regurgitation and burning.  She has a history of Nissen fundoplication.  She notes that she consumes plenty of caffeine and chocolate.  She is interested in trying to improve her diet as a way of also improving her acid reflux.   Past Medical History:  Diagnosis Date  . Arthritis   . GERD (gastroesophageal reflux disease)   . IBS (irritable colon syndrome) 03/31/2017   Constipation dominant   Past Surgical History:  Procedure Laterality Date  . BUNIONECTOMY  2008  . CESAREAN SECTION    .  NISSEN FUNDOPLICATION    . TONSILLECTOMY    . TUBAL LIGATION     Social History   Tobacco Use  . Smoking status: Never Smoker  . Smokeless tobacco: Never Used  Substance Use Topics  . Alcohol use: No    Alcohol/week: 0.0 oz   family history includes COPD in her mother; Cancer in her mother; Depression in her mother; Diabetes in her mother; Heart disease in her mother and paternal grandfather; Hyperlipidemia in her mother; Hypertension in her mother; Miscarriages / Stillbirths in her paternal grandmother.  ROS as above:  Medications: Current Outpatient Medications  Medication Sig Dispense Refill  . Biotin 5 MG CAPS     . Cholecalciferol (VITAMIN D) 2000 units CAPS     . diclofenac sodium (VOLTAREN) 1 % GEL Apply 2 g topically 4 (four) times daily. To affected joint. 100 g 11  . DULoxetine (CYMBALTA) 30 MG capsule Take 1 capsule (30 mg total) by mouth daily. 90 capsule 3  . KRILL OIL PO Take by mouth.    . Naproxen Sodium (ALEVE PO) Take by mouth.    . pantoprazole (PROTONIX) 40 MG tablet Take 1 tablet (40 mg total) by mouth 2 (two) times daily. 60 tablet 3  . albuterol (PROVENTIL HFA;VENTOLIN HFA) 108 (90 Base) MCG/ACT inhaler Inhale 2 puffs into the lungs every 6 (six) hours as needed for wheezing or shortness of breath. 1 Inhaler 0  . ipratropium (ATROVENT) 0.06 % nasal spray Place  2 sprays into both nostrils every 4 (four) hours as needed for rhinitis. 10 mL 6   No current facility-administered medications for this visit.    No Known Allergies  Health Maintenance Health Maintenance  Topic Date Due  . PAP SMEAR  02/14/2019  . MAMMOGRAM  04/15/2019  . TETANUS/TDAP  01/11/2022  . COLONOSCOPY  07/31/2022  . INFLUENZA VACCINE  Completed  . Hepatitis C Screening  Completed  . HIV Screening  Completed     Exam:  BP 136/74   Pulse 71   Temp 98.2 F (36.8 C) (Oral)   Ht 5\' 1"  (1.549 m)   Wt 144 lb (65.3 kg)   LMP 12/07/2017   BMI 27.21 kg/m  Gen: Well NAD HEENT:  EOMI,  MMM nasal discharge.  Posterior pharynx with cobblestoning.  No cervical lymphadenopathy.  No goiter. Lungs: Normal work of breathing. CTABL Heart: RRR no MRG Abd: NABS, Soft. Nondistended, Nontender Exts: Brisk capillary refill, warm and well perfused.  Psych: Alert and oriented normal speech thought process and affect.  No SI or HI expressed.    No results found for this or any previous visit (from the past 72 hour(s)). Dg Chest 2 View  Result Date: 12/14/2017 CLINICAL DATA:  Three months of cough, nonsmoker. EXAM: CHEST  2 VIEW COMPARISON:  None in PACs FINDINGS: The lungs are adequately inflated and clear. The heart and pulmonary vascularity are normal. The mediastinum is normal in width. There is no pleural effusion. The trachea is midline. The bony thorax exhibits no acute abnormality. IMPRESSION: There is no active cardiopulmonary disease. Electronically Signed   By: David  Martinique M.D.   On: 12/14/2017 09:14  I personally (independently) visualized and performed the interpretation of the images attached in this note.    Assessment and Plan: 57 y.o. female with  Cough likely postviral.  Patient seems to have bothersome acid reflux symptoms therefore acid reflux could certainly be a cause of the chronic cough.  Additionally she had notes a pertinent family history of asthma and has a postnasal drainage.  I think is reasonable to increase her Protonix to twice daily use a trial of albuterol and Atrovent nasal spray and recheck in a month or so.  Symptomatic treatment with Tessalon Perles as well.  Fatigue: Likely poor quality and quantity of sleep however will workup metabolic possibility with labs listed below for anemia thyroid vitamin D deficiency.  Recheck in a month.  GERD not well controlled.  Trial of Protonix twice daily.  If not better next step may be gastroenterology.  Continue to work on lifestyle changes.      Orders Placed This Encounter  Procedures  . DG Chest  2 View    Order Specific Question:   Reason for exam:    Answer:   Cough, assess intra-thoracic pathology    Order Specific Question:   Is the patient pregnant?    Answer:   No    Order Specific Question:   Preferred imaging location?    Answer:   [facility]  . CBC  . COMPLETE METABOLIC PANEL WITH GFR  . Lipid Panel w/reflex Direct LDL  . TSH  . Fe+TIBC+Fer  . Hemoglobin A1c  . VITAMIN D 25 Hydroxy (Vit-D Deficiency, Fractures)   Meds ordered this encounter  Medications  . albuterol (PROVENTIL HFA;VENTOLIN HFA) 108 (90 Base) MCG/ACT inhaler    Sig: Inhale 2 puffs into the lungs every 6 (six) hours as needed for wheezing or shortness of breath.  Dispense:  1 Inhaler    Refill:  0  . ipratropium (ATROVENT) 0.06 % nasal spray    Sig: Place 2 sprays into both nostrils every 4 (four) hours as needed for rhinitis.    Dispense:  10 mL    Refill:  6  . pantoprazole (PROTONIX) 40 MG tablet    Sig: Take 1 tablet (40 mg total) by mouth 2 (two) times daily.    Dispense:  60 tablet    Refill:  3     Discussed warning signs or symptoms. Please see discharge instructions. Patient expresses understanding.

## 2017-12-14 NOTE — Patient Instructions (Addendum)
Thank you for coming in today. Try the Atrovent nasal spray  Use albuterol with cough as needed.  Increase Protonix to twice daily.  Get xray and labs today.  Try tessalon during the day.   Let me know how you are doing.  If not better next steps include Gastroenterology or Pulmonology.  We could also do spirometry in clinic.    Cough, Adult A cough helps to clear your throat and lungs. A cough may last only 2-3 weeks (acute), or it may last longer than 8 weeks (chronic). Many different things can cause a cough. A cough may be a sign of an illness or another medical condition. Follow these instructions at home:  Pay attention to any changes in your cough.  Take medicines only as told by your doctor. ? If you were prescribed an antibiotic medicine, take it as told by your doctor. Do not stop taking it even if you start to feel better. ? Talk with your doctor before you try using a cough medicine.  Drink enough fluid to keep your pee (urine) clear or pale yellow.  If the air is dry, use a cold steam vaporizer or humidifier in your home.  Stay away from things that make you cough at work or at home.  If your cough is worse at night, try using extra pillows to raise your head up higher while you sleep.  Do not smoke, and try not to be around smoke. If you need help quitting, ask your doctor.  Do not have caffeine.  Do not drink alcohol.  Rest as needed. Contact a doctor if:  You have new problems (symptoms).  You cough up yellow fluid (pus).  Your cough does not get better after 2-3 weeks, or your cough gets worse.  Medicine does not help your cough and you are not sleeping well.  You have pain that gets worse or pain that is not helped with medicine.  You have a fever.  You are losing weight and you do not know why.  You have night sweats. Get help right away if:  You cough up blood.  You have trouble breathing.  Your heartbeat is very fast. This information  is not intended to replace advice given to you by your health care provider. Make sure you discuss any questions you have with your health care provider. Document Released: 06/12/2011 Document Revised: 03/06/2016 Document Reviewed: 12/06/2014 Elsevier Interactive Patient Education  [employer].

## 2017-12-15 LAB — COMPLETE METABOLIC PANEL WITH GFR
AG Ratio: 1.5 (calc) (ref 1.0–2.5)
ALKALINE PHOSPHATASE (APISO): 62 U/L (ref 33–130)
ALT: 17 U/L (ref 6–29)
AST: 20 U/L (ref 10–35)
Albumin: 4.3 g/dL (ref 3.6–5.1)
BUN: 12 mg/dL (ref 7–25)
CO2: 27 mmol/L (ref 20–32)
Calcium: 9.5 mg/dL (ref 8.6–10.4)
Chloride: 107 mmol/L (ref 98–110)
Creat: 0.91 mg/dL (ref 0.50–1.05)
GFR, Est African American: 83 mL/min/{1.73_m2} (ref >=60)
GFR, Est Non African American: 72 mL/min/{1.73_m2} (ref >=60)
GLUCOSE: 96 mg/dL (ref 65–99)
Globulin: 2.8 g/dL (calc) (ref 1.9–3.7)
Potassium: 4.4 mmol/L (ref 3.5–5.3)
SODIUM: 140 mmol/L (ref 135–146)
Total Bilirubin: 0.3 mg/dL (ref 0.2–1.2)
Total Protein: 7.1 g/dL (ref 6.1–8.1)

## 2017-12-15 LAB — LIPID PANEL W/REFLEX DIRECT LDL
CHOL/HDL RATIO: 3.9 (calc) (ref <5.0)
CHOLESTEROL: 241 mg/dL — AB (ref <200)
HDL: 62 mg/dL (ref >50)
LDL CHOLESTEROL (CALC): 160 mg/dL — AB
Non-HDL Cholesterol (Calc): 179 mg/dL (calc) — ABNORMAL HIGH (ref <130)
Triglycerides: 84 mg/dL (ref <150)

## 2017-12-15 LAB — CBC
HCT: 34.5 % — ABNORMAL LOW (ref 35.0–45.0)
Hemoglobin: 11.3 g/dL — ABNORMAL LOW (ref 11.7–15.5)
MCH: 26.8 pg — ABNORMAL LOW (ref 27.0–33.0)
MCHC: 32.8 g/dL (ref 32.0–36.0)
MCV: 81.9 fL (ref 80.0–100.0)
MPV: 10.5 fL (ref 7.5–12.5)
Platelets: 420 10*3/uL — ABNORMAL HIGH (ref 140–400)
RBC: 4.21 10*6/uL (ref 3.80–5.10)
RDW: 13.8 % (ref 11.0–15.0)
WBC: 5.2 10*3/uL (ref 3.8–10.8)

## 2017-12-15 LAB — IRON,TIBC AND FERRITIN PANEL
%SAT: 3 % (calc) — ABNORMAL LOW (ref 11–50)
FERRITIN: 7 ng/mL — AB (ref 10–232)
Iron: 15 ug/dL — ABNORMAL LOW (ref 45–160)
TIBC: 440 ug/dL (ref 250–450)

## 2017-12-15 LAB — VITAMIN D 25 HYDROXY (VIT D DEFICIENCY, FRACTURES): VIT D 25 HYDROXY: 34 ng/mL (ref 30–100)

## 2017-12-15 LAB — HEMOGLOBIN A1C
Hgb A1c MFr Bld: 5.7 % of total Hgb — ABNORMAL HIGH (ref <5.7)
Mean Plasma Glucose: 117 (calc)
eAG (mmol/L): 6.5 (calc)

## 2017-12-15 LAB — TSH: TSH: 2.03 mIU/L

## 2018-01-01 MED FILL — DULoxetine HCL 30 MG CPEP: 30 | 90 days supply | Qty: 90 | Fill #3

## 2018-03-25 ENCOUNTER — Encounter: Payer: Self-pay | Admitting: Obstetrics & Gynecology

## 2018-03-29 ENCOUNTER — Encounter

## 2018-03-29 ENCOUNTER — Ambulatory Visit (INDEPENDENT_AMBULATORY_CARE_PROVIDER_SITE_OTHER): Payer: 59 | Admitting: Family Medicine

## 2018-03-29 VITALS — BP 114/76 | HR 76 | Ht 61.0 in | Wt 140.0 lb

## 2018-03-29 DIAGNOSIS — K2100 Gastro-esophageal reflux disease with esophagitis, without bleeding: Secondary | ICD-10-CM

## 2018-03-29 DIAGNOSIS — Z1231 Encounter for screening mammogram for malignant neoplasm of breast: Secondary | ICD-10-CM

## 2018-03-29 DIAGNOSIS — D500 Iron deficiency anemia secondary to blood loss (chronic): Secondary | ICD-10-CM | POA: Diagnosis not present

## 2018-03-29 DIAGNOSIS — R300 Dysuria: Secondary | ICD-10-CM

## 2018-03-29 DIAGNOSIS — M79643 Pain in unspecified hand: Secondary | ICD-10-CM | POA: Diagnosis not present

## 2018-03-29 DIAGNOSIS — M545 Low back pain: Secondary | ICD-10-CM

## 2018-03-29 DIAGNOSIS — R7303 Prediabetes: Secondary | ICD-10-CM | POA: Diagnosis not present

## 2018-03-29 DIAGNOSIS — G8929 Other chronic pain: Secondary | ICD-10-CM

## 2018-03-29 DIAGNOSIS — Z1239 Encounter for other screening for malignant neoplasm of breast: Secondary | ICD-10-CM

## 2018-03-29 LAB — POCT URINALYSIS DIPSTICK
Bilirubin, UA: NEGATIVE
GLUCOSE UA: NEGATIVE
Ketones, UA: NEGATIVE
LEUKOCYTES UA: NEGATIVE
Nitrite, UA: NEGATIVE
PH UA: 5.5 (ref 5.0–8.0)
Protein, UA: NEGATIVE
RBC UA: NEGATIVE
Spec Grav, UA: 1.01 (ref 1.010–1.025)
Urobilinogen, UA: 0.2 E.U./dL

## 2018-03-29 MED ORDER — PANTOPRAZOLE SODIUM 40 MG PO TBEC: 40.0000 mg | DELAYED_RELEASE_TABLET | Freq: Every day | ORAL | 3 refills | Status: DC

## 2018-03-29 MED ORDER — DULOXETINE HCL 30 MG PO CPEP: 30.0000 mg | ORAL_CAPSULE | Freq: Every day | ORAL | 3 refills | Status: DC

## 2018-03-29 MED ORDER — PROBIOTIC 250 MG PO CAPS: 1.0000 | ORAL_CAPSULE | Freq: Every day | ORAL | Status: DC

## 2018-03-29 MED ORDER — ACETAMINOPHEN ER 650 MG PO TBCR: 1300.0000 mg | EXTENDED_RELEASE_TABLET | Freq: Three times a day (TID) | ORAL | 3 refills | Status: AC | PRN

## 2018-03-29 MED ORDER — ATORVASTATIN CALCIUM 20 MG PO TABS: 20.0000 mg | ORAL_TABLET | Freq: Every day | ORAL | 0 refills | Status: DC

## 2018-03-29 MED ORDER — FERROUS BISGLYCINATE CHELATE 15 MG PO TABS: 1.0000 | ORAL_TABLET | Freq: Three times a day (TID) | ORAL | Status: AC

## 2018-03-29 MED ORDER — DICLOFENAC SODIUM 1 % TD GEL: 2.0000 g | Freq: Four times a day (QID) | TRANSDERMAL | 11 refills | Status: AC

## 2018-03-29 MED FILL — PANTOPRAZOLE SOD DR 40 MG T: 40 | 90 days supply | Qty: 90 | Fill #0

## 2018-03-29 MED FILL — ATORVASTATIN 20 MG TABLET: 20 | 90 days supply | Qty: 90 | Fill #0

## 2018-03-29 MED FILL — DICLOFENAC SODIUM 1% GEL: 1 | 12 days supply | Qty: 100 | Fill #0

## 2018-03-29 MED FILL — DULoxetine HCL 30 MG CPEP: 30 | 90 days supply | Qty: 90 | Fill #0

## 2018-03-29 NOTE — Patient Instructions (Addendum)
Thank you for coming in today. Start lipitor daily.  Try to get as much iron as you can.  Take 2-3x daily Iron Bisglycinate is well tolerated.  We will want to check labs in about 6 weeks fasting.   Check fasting labs in about 6 -12 weeks.  You do not need to see me.   Atorvastatin tablets What is this medicine? ATORVASTATIN (a TORE va sta tin) is known as a HMG-CoA reductase inhibitor or 'statin'. It lowers the level of cholesterol and triglycerides in the blood. This drug may also reduce the risk of heart attack, stroke, or other health problems in patients with risk factors for heart disease. Diet and lifestyle changes are often used with this drug. This medicine may be used for other purposes; ask your health care provider or pharmacist if you have questions. COMMON BRAND NAME(S): Lipitor What should I tell my health care provider before I take this medicine? They need to know if you have any of these conditions: -frequently drink alcoholic beverages -history of stroke, TIA -kidney disease -liver disease -muscle aches or weakness -other medical condition -an unusual or allergic reaction to atorvastatin, other medicines, foods, dyes, or preservatives -pregnant or trying to get pregnant -breast-feeding How should I use this medicine? Take this medicine by mouth with a glass of water. Follow the directions on the prescription label. You can take this medicine with or without food. Take your doses at regular intervals. Do not take your medicine more often than directed. Talk to your pediatrician regarding the use of this medicine in children. While this drug may be prescribed for children as young as 33 years old for selected conditions, precautions do apply. Overdosage: If you think you have taken too much of this medicine contact a poison control center or emergency room at once. NOTE: This medicine is only for you. Do not share this medicine with others. What if I miss a dose? If  you miss a dose, take it as soon as you can. If it is almost time for your next dose, take only that dose. Do not take double or extra doses. What may interact with this medicine? Do not take this medicine with any of the following medications: -red yeast rice -telaprevir -telithromycin -voriconazole This medicine may also interact with the following medications: -alcohol -antiviral medicines for HIV or AIDS -boceprevir -certain antibiotics like clarithromycin, erythromycin, troleandomycin -certain medicines for cholesterol like fenofibrate or gemfibrozil -cimetidine -clarithromycin -colchicine -cyclosporine -digoxin -female hormones, like estrogens or progestins and birth control pills -grapefruit juice -medicines for fungal infections like fluconazole, itraconazole, ketoconazole -niacin -rifampin -spironolactone This list may not describe all possible interactions. Give your health care provider a list of all the medicines, herbs, non-prescription drugs, or dietary supplements you use. Also tell them if you smoke, drink alcohol, or use illegal drugs. Some items may interact with your medicine. What should I watch for while using this medicine? Visit your doctor or health care professional for regular check-ups. You may need regular tests to make sure your liver is working properly. Tell your doctor or health care professional right away if you get any unexplained muscle pain, tenderness, or weakness, especially if you also have a fever and tiredness. Your doctor or health care professional may tell you to stop taking this medicine if you develop muscle problems. If your muscle problems do not go away after stopping this medicine, contact your health care professional. This drug is only part of a total heart-health program. Your  doctor or a dietician can suggest a low-cholesterol and low-fat diet to help. Avoid alcohol and smoking, and keep a proper exercise schedule. Do not use this  drug if you are pregnant or breast-feeding. Serious side effects to an unborn child or to an infant are possible. Talk to your doctor or pharmacist for more information. This medicine may affect blood sugar levels. If you have diabetes, check with your doctor or health care professional before you change your diet or the dose of your diabetic medicine. If you are going to have surgery tell your health care professional that you are taking this drug. What side effects may I notice from receiving this medicine? Side effects that you should report to your doctor or health care professional as soon as possible: -allergic reactions like skin rash, itching or hives, swelling of the face, lips, or tongue -dark urine -fever -joint pain -muscle cramps, pain -redness, blistering, peeling or loosening of the skin, including inside the mouth -trouble passing urine or change in the amount of urine -unusually weak or tired -yellowing of eyes or skin Side effects that usually do not require medical attention (report to your doctor or health care professional if they continue or are bothersome): -constipation -heartburn -stomach gas, pain, upset This list may not describe all possible side effects. Call your doctor for medical advice about side effects. You may report side effects to FDA at 1-800-FDA-1088. Where should I keep my medicine? Keep out of the reach of children. Store at room temperature between 20 to 25 degrees C (68 to 77 degrees F). Throw away any unused medicine after the expiration date. NOTE: This sheet is a summary. It may not cover all possible information. If you have questions about this medicine, talk to your doctor, pharmacist, or health care provider.  2018 Elsevier/Gold Standard (2011-08-19 50:03:70)

## 2018-03-29 NOTE — Progress Notes (Signed)
Julie Burton is a 57 y.o. female who presents to [place]: [place] today for follow up anemia, hyperlipidemia, back pain, and discuss dysuria.   HLD: Julie Burton has labs about 3 months ago that showed LDL at 160. She has a family history of CVD and is willing to start taking statin medications. She has never been on these medicines before. She feels pretty well today with no significant muscle aches or pains.   Iron deficiency anemia:  Julie Burton has heavy menstrual bleeding and is planning on seeing her OBGYN to discuss definitive management. She is taking daily oral iron but finds taking it more than 1x daily to be obnoxious.    Additionally she takes Cymbalta daily for back pain: She tolerates it well. She notes that it helps a bit. She tolerates it will.   Dysuria: Present mildly for a few weeks.  She notes that she is been drinking lots of water which seems to help.  Not sure she has a urinary tract infection.  She denies severe flank pain fevers or chills nausea vomiting or diarrhea.   ROS as above:  Exam:  BP 114/76   Pulse 76   Ht 5\' 1"  (1.549 m)   Wt 140 lb (63.5 kg)   BMI 26.45 kg/m   Gen: Well NAD HEENT: EOMI,  MMM Lungs: Normal work of breathing. CTABL Heart: RRR no MRG Abd: NABS, Soft. Nondistended, Nontender no CVA angle tenderness to percussion Exts: Brisk capillary refill, warm and well perfused.  Spine: Normal motion normal gait.  Lower extremity strength is intact.   Lab and Radiology Results Lab Results  Component Value Date   CHOL 241 (H) 12/14/2017   HDL 62 12/14/2017   LDLCALC 160 (H) 12/14/2017   TRIG 84 12/14/2017   CHOLHDL 3.9 12/14/2017     Chemistry      Component Value Date/Time   NA 140 12/14/2017 0854   K 4.4 12/14/2017 0854   CL 107 12/14/2017 0854   CO2 27 12/14/2017 0854   BUN 12 12/14/2017 0854   CREATININE 0.91 12/14/2017 0854      Component Value Date/Time   CALCIUM 9.5 12/14/2017 0854   ALKPHOS 64 03/31/2017 1025   AST 20 12/14/2017 0854   ALT 17 12/14/2017 0854   BILITOT 0.3 12/14/2017 0854     Lab Results  Component Value Date   WBC 5.2 12/14/2017   HGB 11.3 (L) 12/14/2017   HCT 34.5 (L) 12/14/2017   MCV 81.9 12/14/2017   PLT 420 (H) 12/14/2017   Lab Results  Component Value Date   IRON 15 (L) 12/14/2017   TIBC 440 12/14/2017   FERRITIN 7 (L) 12/14/2017    Results for orders placed or performed in visit on 03/29/18 (from the past 24 hour(s))  POCT Urinalysis Dipstick     Status: None   Collection Time: 03/29/18  9:08 AM  Result Value Ref Range   Color, UA yellow    Clarity, UA clear    Glucose, UA Negative Negative   Bilirubin, UA negative    Ketones, UA negative    Spec Grav, UA 1.010 1.010 - 1.025   Blood, UA negative    pH, UA 5.5 5.0 - 8.0   Protein, UA Negative Negative   Urobilinogen, UA 0.2 0.2 or 1.0 E.U./dL   Nitrite, UA negative    Leukocytes, UA Negative Negative   Appearance     Odor  Assessment and Plan: 57 y.o. female with  Hyperlipidemia: LDL 160. Plan to start Lipitor and recheck Lipids and CMP in 6 weeks.   Iron deficiency anemia: Plan to increase oral iron to 2 or 3 times daily using iron bisglycinate.  Check iron studies in 6 weeks.  Consider IV iron.  Also consider definitive management of heavy menstrual bleeding with OB/GYN next month.  Back pain: Doing reasonably well continue Cymbalta and Tylenol.  Dysuria: POCT UA negaitve. Culture pending. Follow up with OBGYN as scheduled if no UTI present on culture.   Mammo ordered as it will be due next month.    Orders Placed This Encounter  Procedures  . Urine Culture  . MM 3D SCREEN BREAST BILATERAL    Standing Status:   Future    Standing Expiration Date:   05/30/2019    Scheduling Instructions:     After July 3rd    Order Specific Question:   Reason for Exam (SYMPTOM  OR DIAGNOSIS REQUIRED)     Answer:   screen breast cancer    Order Specific Question:   Is the patient pregnant?    Answer:   No    Order Specific Question:   Preferred imaging location?    Answer:   [facility]  . CBC  . COMPLETE METABOLIC PANEL WITH GFR  . Lipid Panel w/reflex Direct LDL  . Fe+TIBC+Fer  . Retic  . POCT Urinalysis Dipstick   Meds ordered this encounter  Medications  . pantoprazole (PROTONIX) 40 MG tablet    Sig: Take 1 tablet (40 mg total) by mouth daily.    Dispense:  90 tablet    Refill:  3  . DULoxetine (CYMBALTA) 30 MG capsule    Sig: Take 1 capsule (30 mg total) by mouth daily.    Dispense:  90 capsule    Refill:  3  . acetaminophen (TYLENOL) 650 MG CR tablet    Sig: Take 2 tablets (1,300 mg total) by mouth every 8 (eight) hours as needed for pain.    Dispense:  90 tablet    Refill:  3  . Saccharomyces boulardii (PROBIOTIC) 250 MG CAPS    Sig: Take 1 capsule by mouth daily.    Dispense:  14 capsule  . diclofenac sodium (VOLTAREN) 1 % GEL    Sig: Apply 2 g topically 4 (four) times daily. To affected joint.    Dispense:  100 g    Refill:  11  . atorvastatin (LIPITOR) 20 MG tablet    Sig: Take 1 tablet (20 mg total) by mouth daily.    Dispense:  90 tablet    Refill:  0  . Ferrous Bisglycinate Chelate 15 MG TABS    Sig: Take 1 tablet by mouth 3 (three) times daily.    Dispense:  30 tablet     Historical information moved to improve visibility of documentation.  Past Medical History:  Diagnosis Date  . Arthritis   . GERD (gastroesophageal reflux disease)   . IBS (irritable colon syndrome) 03/31/2017   Constipation dominant   Past Surgical History:  Procedure Laterality Date  . BUNIONECTOMY  2008  . CESAREAN SECTION    . NISSEN FUNDOPLICATION    . TONSILLECTOMY    . TUBAL LIGATION     Social History   Tobacco Use  . Smoking status: Never Smoker  . Smokeless tobacco: Never Used  Substance Use Topics  . Alcohol use: No    Alcohol/week: 0.0 oz   family  history includes COPD in her mother; Cancer in her mother; Depression in her mother; Diabetes in her mother; Heart disease in her mother and paternal grandfather; Hyperlipidemia in her mother; Hypertension in her mother; 25 / Stillbirths in her paternal grandmother.  Medications: Current Outpatient Medications  Medication Sig Dispense Refill  . albuterol (PROVENTIL HFA;VENTOLIN HFA) 108 (90 Base) MCG/ACT inhaler Inhale 2 puffs into the lungs every 6 (six) hours as needed for wheezing or shortness of breath. 1 Inhaler 0  . Biotin 5 MG CAPS     . Cholecalciferol (VITAMIN D) 2000 units CAPS     . diclofenac sodium (VOLTAREN) 1 % GEL Apply 2 g topically 4 (four) times daily. To affected joint. 100 g 11  . DULoxetine (CYMBALTA) 30 MG capsule Take 1 capsule (30 mg total) by mouth daily. 90 capsule 3  . KRILL OIL PO Take by mouth.    . pantoprazole (PROTONIX) 40 MG tablet Take 1 tablet (40 mg total) by mouth daily. 90 tablet 3  . acetaminophen (TYLENOL) 650 MG CR tablet Take 2 tablets (1,300 mg total) by mouth every 8 (eight) hours as needed for pain. 90 tablet 3  . atorvastatin (LIPITOR) 20 MG tablet Take 1 tablet (20 mg total) by mouth daily. 90 tablet 0  . Ferrous Bisglycinate Chelate 15 MG TABS Take 1 tablet by mouth 3 (three) times daily. 30 tablet   . Saccharomyces boulardii (PROBIOTIC) 250 MG CAPS Take 1 capsule by mouth daily. 14 capsule    No current facility-administered medications for this visit.    No Known Allergies  Health Maintenance Health Maintenance  Topic Date Due  . INFLUENZA VACCINE  05/13/2018  . PAP SMEAR  02/14/2019  . MAMMOGRAM  04/15/2019  . TETANUS/TDAP  01/11/2022  . COLONOSCOPY  07/31/2022  . Hepatitis C Screening  Completed  . HIV Screening  Completed    Discussed warning signs or symptoms. Please see discharge instructions. Patient expresses understanding.

## 2018-03-30 LAB — URINE CULTURE
MICRO NUMBER:: 90723866
SPECIMEN QUALITY:: ADEQUATE

## 2018-04-09 ENCOUNTER — Telehealth: Payer: 59 | Admitting: Family

## 2018-04-09 ENCOUNTER — Encounter: Payer: Self-pay | Admitting: Family Medicine

## 2018-04-09 DIAGNOSIS — N390 Urinary tract infection, site not specified: Secondary | ICD-10-CM | POA: Diagnosis not present

## 2018-04-09 MED ORDER — CEPHALEXIN 500 MG PO CAPS: 500.0000 mg | ORAL_CAPSULE | Freq: Two times a day (BID) | ORAL | 0 refills | Status: DC

## 2018-04-09 MED ORDER — PHENAZOPYRIDINE HCL 200 MG PO TABS: 200.0000 mg | ORAL_TABLET | Freq: Three times a day (TID) | ORAL | 0 refills | Status: AC

## 2018-04-09 MED FILL — CEPHALEXIN 500 MG CAPSULE: 500 | 7 days supply | Qty: 14 | Fill #0

## 2018-04-09 NOTE — Progress Notes (Signed)

## 2018-04-21 ENCOUNTER — Ambulatory Visit (INDEPENDENT_AMBULATORY_CARE_PROVIDER_SITE_OTHER): Payer: 59

## 2018-04-21 ENCOUNTER — Ambulatory Visit: Payer: 59 | Admitting: Obstetrics & Gynecology

## 2018-04-21 DIAGNOSIS — Z1231 Encounter for screening mammogram for malignant neoplasm of breast: Secondary | ICD-10-CM

## 2018-04-21 DIAGNOSIS — Z1239 Encounter for other screening for malignant neoplasm of breast: Secondary | ICD-10-CM

## 2018-04-27 ENCOUNTER — Encounter: Payer: Self-pay | Admitting: Family Medicine

## 2018-05-10 ENCOUNTER — Encounter: Payer: Self-pay | Admitting: Obstetrics & Gynecology

## 2018-05-10 ENCOUNTER — Ambulatory Visit (INDEPENDENT_AMBULATORY_CARE_PROVIDER_SITE_OTHER): Payer: 59 | Admitting: Obstetrics & Gynecology

## 2018-05-10 VITALS — BP 123/77 | HR 68 | Resp 16 | Ht 61.0 in | Wt 140.0 lb

## 2018-05-10 DIAGNOSIS — Z1151 Encounter for screening for human papillomavirus (HPV): Secondary | ICD-10-CM

## 2018-05-10 DIAGNOSIS — N393 Stress incontinence (female) (male): Secondary | ICD-10-CM

## 2018-05-10 DIAGNOSIS — Z01419 Encounter for gynecological examination (general) (routine) without abnormal findings: Secondary | ICD-10-CM | POA: Diagnosis not present

## 2018-05-10 DIAGNOSIS — Z124 Encounter for screening for malignant neoplasm of cervix: Secondary | ICD-10-CM | POA: Diagnosis not present

## 2018-05-10 DIAGNOSIS — Z803 Family history of malignant neoplasm of breast: Secondary | ICD-10-CM

## 2018-05-10 NOTE — Progress Notes (Signed)
Subjective:     Julie Burton is a 57 y.o. female here for a routine exam.  Current complaints: Has monthly menses like "clockwork"  No hot flashes.  Menses are heavy but no irregular bleeding.  Menses last 7 days.  No hot flashes.  Pt had episode of bladder irritaiton and felt better with pyridium.  Did an e-visit and was prescribed Keflex which did not help.  Pyridum did.  Also feels better without caffeine.  Pt has some stress incontinence.   Gynecologic History Patient's last menstrual period was 04/25/2018. Contraception: none Last Pap: 2017. Results were: normal cytology + HPV Last mammogram: 2019. Results were: normal  Obstetric History OB History  Gravida Para Term Preterm AB Living  7 6     1 6   SAB TAB Ectopic Multiple Live Births  1            # Outcome Date GA Lbr Len/2nd Weight Sex Delivery Anes PTL Lv  7 SAB           6 Para           5 Para           4 Para           3 Para           2 Para           1 Para              The following portions of the patient's history were reviewed and updated as appropriate: allergies, current medications, past family history, past medical history, past social history, past surgical history and problem list.  Review of Systems Pertinent items noted in HPI and remainder of comprehensive ROS otherwise negative.    Objective:      Vitals:   05/10/18 1426  BP: 123/77  Pulse: 68  Resp: 16  Weight: 140 lb (63.5 kg)  Height: 5\' 1"  (1.549 m)   Vitals:  WNL General appearance: alert, cooperative and no distress  HEENT: Normocephalic, without obvious abnormality, atraumatic Eyes: negative Throat: lips, mucosa, and tongue normal; teeth and gums normal  Respiratory: Clear to auscultation bilaterally  CV: Regular rate and rhythm  Breasts:  Normal appearance, no masses or tenderness, no nipple retraction or dimpling  GI: Soft, non-tender; bowel sounds normal; no masses,  no organomegaly  GU: External Genitalia:  Tanner V, no  lesion Urethra:  No prolapse   Vagina: Pink, normal rugae, no blood or discharge; mild cystocele  Cervix: Irregular cervix with appearance of old cervical laceration at 12 o'clock.  Thin on bimanual exam.  No lesion on cervix but cervix has irregular contour due to old cervical laceration (NSVD x5)  Uterus:  Normal size and contour, non tender  Adnexa: Normal, no masses, non tender  Musculoskeletal: No edema, redness or tenderness in the calves or thighs  Skin: No lesions or rash  Lymphatic: Axillary adenopathy: none     Psychiatric: Normal mood and behavior        Assessment:    Healthy female exam.   Heavy, regular menses. Stress incont.   Plan:    Pap with co testing Pelvic PT for incont. Yearly mammogram  Mother with his tory of breast cancer; questionably GGM on same side.  Pt to ask mother if she had genetic testing.

## 2018-05-11 ENCOUNTER — Other Ambulatory Visit: Payer: 59

## 2018-05-11 LAB — COMPREHENSIVE METABOLIC PANEL
AG RATIO: 1.6 (calc) (ref 1.0–2.5)
ALBUMIN MSPROF: 4.6 g/dL (ref 3.6–5.1)
ALKALINE PHOSPHATASE (APISO): 67 U/L (ref 33–130)
ALT: 15 U/L (ref 6–29)
AST: 16 U/L (ref 10–35)
BUN: 9 mg/dL (ref 7–25)
CHLORIDE: 106 mmol/L (ref 98–110)
CO2: 26 mmol/L (ref 20–32)
CREATININE: 0.79 mg/dL (ref 0.50–1.05)
Calcium: 9.7 mg/dL (ref 8.6–10.4)
GLOBULIN: 2.8 g/dL (ref 1.9–3.7)
Glucose, Bld: 90 mg/dL (ref 65–99)
POTASSIUM: 4 mmol/L (ref 3.5–5.3)
Sodium: 139 mmol/L (ref 135–146)
Total Bilirubin: 0.4 mg/dL (ref 0.2–1.2)
Total Protein: 7.4 g/dL (ref 6.1–8.1)

## 2018-05-11 LAB — CBC
HCT: 40.8 % (ref 35.0–45.0)
HEMOGLOBIN: 13.6 g/dL (ref 11.7–15.5)
MCH: 30.8 pg (ref 27.0–33.0)
MCHC: 33.3 g/dL (ref 32.0–36.0)
MCV: 92.5 fL (ref 80.0–100.0)
MPV: 10.5 fL (ref 7.5–12.5)
PLATELETS: 315 10*3/uL (ref 140–400)
RBC: 4.41 10*6/uL (ref 3.80–5.10)
RDW: 13.3 % (ref 11.0–15.0)
WBC: 8 10*3/uL (ref 3.8–10.8)

## 2018-05-11 LAB — LIPID PANEL
CHOL/HDL RATIO: 3.1 (calc) (ref <5.0)
CHOLESTEROL: 181 mg/dL (ref <200)
HDL: 59 mg/dL (ref >50)
LDL Cholesterol (Calc): 96 mg/dL (calc)
NON-HDL CHOLESTEROL (CALC): 122 mg/dL (ref <130)
Triglycerides: 159 mg/dL — ABNORMAL HIGH (ref <150)

## 2018-05-13 LAB — CYTOLOGY - PAP
DIAGNOSIS: NEGATIVE
HPV: NOT DETECTED

## 2018-05-18 ENCOUNTER — Ambulatory Visit: Payer: 59 | Admitting: Physical Therapy

## 2018-05-25 ENCOUNTER — Other Ambulatory Visit: Payer: Self-pay

## 2018-05-25 ENCOUNTER — Ambulatory Visit: Payer: 59 | Attending: Obstetrics & Gynecology | Admitting: Physical Therapy

## 2018-05-25 ENCOUNTER — Encounter: Payer: Self-pay | Admitting: Physical Therapy

## 2018-05-25 DIAGNOSIS — R278 Other lack of coordination: Secondary | ICD-10-CM | POA: Diagnosis not present

## 2018-05-25 DIAGNOSIS — M6281 Muscle weakness (generalized): Secondary | ICD-10-CM | POA: Insufficient documentation

## 2018-05-25 NOTE — Therapy (Signed)
[facility] [address] [place], [place], [address] Phone: [phone]   Fax:  [phone]  Physical Therapy Evaluation  Patient Details  Name: Julie Burton MRN: [id number] Date of Birth: 03-13-1961 Referring Provider: Dr. Silas Sacramento   Encounter Date: 05/25/2018  PT End of Session - 05/25/18 1202    Visit Number  1    Date for PT Re-Evaluation  08/17/18    Authorization Type  [facility]    PT Start Time  1100    PT Stop Time  1140    PT Time Calculation (min)  40 min    Activity Tolerance  Patient tolerated treatment well    Behavior During Therapy  [facility] for tasks assessed/performed       Past Medical History:  Diagnosis Date  . Anemia   . Arthritis   . GERD (gastroesophageal reflux disease)   . IBS (irritable colon syndrome) 03/31/2017   Constipation dominant  . Vaginal Pap smear, abnormal    HPV    Past Surgical History:  Procedure Laterality Date  . BUNIONECTOMY  2008  . CESAREAN SECTION    . COLPOSCOPY    . NISSEN FUNDOPLICATION    . TONSILLECTOMY    . TUBAL LIGATION      There were no vitals filed for this visit.   Subjective Assessment - 05/25/18 1016    Subjective  The stress incontinence started a few years ago.  Patient has 6 kids.  Patient is not able to stop a stream of urine. Patient leaks urine with coughing, lifting, strenously running.  Leaks a small amount.  Patient wears a light day pad. I have issues of constipation.     Patient Stated Goals  reduce leakage to none    Currently in Pain?  No/denies    Multiple Pain Sites  No         [facility] PT Assessment - 05/25/18 0001      Assessment   Medical Diagnosis  N39.3 Stress Incontinence    Referring Provider  Dr. Silas Sacramento    Onset Date/Surgical Date  05/25/16    Prior Therapy  none      Precautions   Precautions  None      Restrictions   Weight Bearing Restrictions  No      Balance Screen   Has the patient fallen in the  past 6 months  No    Has the patient had a decrease in activity level because of a fear of falling?   No    Is the patient reluctant to leave their home because of a fear of falling?   No      Home Film/video editor residence      Prior Function   Level of Independence  Independent    Vocation  Full time employment    Occupational psychologist, on [place], Development worker, international aid, cooking, walk dog      Cognition   Overall Cognitive Status  Within Functional Limits for tasks assessed      ROM / Strength   AROM / PROM / Strength  AROM;PROM;Strength      AROM   Overall AROM   Due to precautions    Overall AROM Comments  full lumbar ROM      Strength   Overall Strength Comments  abdominal strength is 2/5 with pushing out    Right Hip ABduction  4/5  Left Hip ABduction  4-/5      Palpation   Spinal mobility  right side of L4-L5 decreased mobility    SI assessment   Right ilium is anteriorly rotated; sacrum rotated left    Palpation comment  tightness on the left quadatus, decreased mobiity of the diaphgram                Objective measurements completed on examination: See above findings.    Pelvic Floor Special Questions - 05/25/18 0001    Prior Pregnancies  Yes    Number of Pregnancies  6    Number of C-Sections  1    Number of Vaginal Deliveries  5    Episiotomy Performed  Yes    Currently Sexually Active  Yes    Is this Painful  No    Marinoff Scale  no problems    Urinary Leakage  Yes    Pad use  small light day pad    Activities that cause leaking  Coughing;Running;Lifting    Urinary urgency  No    Urinary frequency  no    Fecal incontinence  No    Caffeine beverages  diet Dr. Malachi Bonds, drinks water    Pelvic Floor Internal Exam  Patient confirms identification and approves therapist to assess the pelvic floor and treat    Exam Type  Vaginal    Strength  Flicker               PT Education - 05/25/18 1202     Education Details  pelvic floor constraction with ball squeeze; information on bladder irritants    Person(s) Educated  Patient    Methods  Explanation;Demonstration;Verbal cues;Handout    Comprehension  Returned demonstration;Verbalized understanding       PT Short Term Goals - 05/25/18 1208      PT SHORT TERM GOAL #1   Title  I with initial HEP    Time  4    Period  Weeks    Status  New    Target Date  06/22/18      PT SHORT TERM GOAL #2   Title  understand how bladder irritants affect the bladder and increase urinary leakage    Time  4    Period  Weeks    Status  New    Target Date  06/22/18      PT SHORT TERM GOAL #3   Title  understand ways to manage constipation to decrease strain on the pelvic floor with abdominal massage and toileting techniques    Time  4    Period  Weeks    Status  New    Target Date  06/22/18      PT SHORT TERM GOAL #4   Title  ability to move the diaphgram with the pelvic floor to improve coordination of the pelvic floor muscles    Time  4    Period  Weeks    Status  New    Target Date  06/22/18        PT Long Term Goals - 05/25/18 1210      PT LONG TERM GOAL #1   Title  independent with HEP and understands how to advance herself    Time  12    Status  New    Target Date  08/17/18      PT LONG TERM GOAL #2   Title  urinary leakage with coughing decreased >/= 75% due to pelvic floor strength >/=  4/5 holding for 5 seconds    Time  12    Period  Weeks    Status  New    Target Date  08/17/18      PT LONG TERM GOAL #3   Title  urinary leakage with lifting decreased >/= 75% due to improve pelvic floor strength and ability to not hold her breath    Time  12    Period  Weeks    Status  New    Target Date  08/17/18      PT LONG TERM GOAL #4   Title  constipation improved >/= 50% so she is not straining the pelvic floor    Time  12    Period  Days    Status  New    Target Date  08/17/18      PT LONG TERM GOAL #5   Title   ------------------             Plan - 05/25/18 1203    Clinical Impression Statement  Patient is a 57 year old female with stress incontinence for several years.  Patient reports urinary leakage with coughing and heavy lifting.  Patient reports no pain.  Pelvic floor strength is 1/5 and will bear down several times instread of lifting the pelvic floor.  Decreased exursion of the perineal body with contraction and bulging.  No palpable tenderness in the pelvic floor.  Abdominal strength is 2/5 and bilateral hip abduction strength is 4/5.  Right ilium is rotated anteriorly.  Decreased mobility of right side of L4-L5.  Sacrum is rotated left.  Patient has issues with constipation.  Diaphgram is not moving correctly and patient will bulge her abdomen when trying to contract. Patient will benefit from skilled therapy to improve pelvic floor strength and coordination to reduce urinary leakage and constipation.     History and Personal Factors relevant to plan of care:  None    Clinical Presentation  Stable    Clinical Presentation due to:  stable condition    Clinical Decision Making  Low    Rehab Potential  Excellent    Clinical Impairments Affecting Rehab Potential  None    PT Frequency  1x / week    PT Duration  12 weeks    PT Treatment/Interventions  Biofeedback;Therapeutic activities;Therapeutic exercise;Patient/family education;Neuromuscular re-education;Manual techniques;Dry needling    PT Next Visit Plan  correct pelvis, improve mobility of the right L4-L5 vertebrae, correct sacrum; work with diaphgram, pelvic floor EMG    Consulted and Agree with Plan of Care  Patient       Patient will benefit from skilled therapeutic intervention in order to improve the following deficits and impairments:  Decreased coordination, Decreased activity tolerance, Decreased endurance, Decreased strength  Visit Diagnosis: Muscle weakness (generalized) - Plan: PT plan of care cert/re-cert  Other lack of  coordination - Plan: PT plan of care cert/re-cert     Problem List Patient Active Problem List   Diagnosis Date Noted  . Family history of breast cancer 05/10/2018  . IBS (irritable colon syndrome) 03/31/2017  . Left carpal tunnel syndrome 08/18/2016  . Heavy menses 07/17/2016  . Vitamin D deficiency 03/07/2016  . Anemia, iron deficiency 03/07/2016  . Prediabetes 03/07/2016  . Lumbago 08/01/2015    Earlie Counts, PT 05/25/18 12:15 PM   [facility] [address] [place], [place], [address] Phone: [phone]   Fax:  [phone]  Name: Julie Burton MRN: [id number] Date  of Birth: 10-04-1961

## 2018-05-25 NOTE — Patient Instructions (Addendum)
Adduction: Hip - Knees Together With Pelvic Floor (Hook-Lying)    Lie with hips and knees bent, towel roll between knees. Squeeze pelvic floor while pushing knees together. Hold for __5_ seconds. Rest for _5__ seconds. Repeat _5__ times. Do __3_ times a day.   Copyright  VHI. All rights reserved.  Certain foods and liquids will decrease the pH making the urine more acidic.  Urinary urgency increases when the urine has a low pH.  Most common irritants: alcohol, carbonated beverages and caffinated beverages.  Foods to avoid: apple juice, apples, ascorbic acid, canteloupes, chili, citrus fruits, coffee, cranberries, grapes, guava, peaches, pepper, pineapple, plums, strawberries, tea, tomatoes, and vinegar.  Drinking plenty of water may help to increase the pH and dilute out any of the effects of specific irritants.  Foods that are NOT irritating to the bladder include: Pears, papayas, sun-brewed teas, watermelons, non-citrus herbal teas, apricots, kava and low-acid instant drinks (Postum)  [facility] [address], [place], [place] [address] Phone # [phone] Fax [phone]

## 2018-07-07 ENCOUNTER — Other Ambulatory Visit: Payer: Self-pay | Admitting: Family Medicine

## 2018-07-07 MED FILL — PANTOPRAZOLE SOD DR 40 MG T: 40 | 90 days supply | Qty: 90 | Fill #1

## 2018-07-07 MED FILL — DULoxetine HCL 30 MG CPEP: 30 | 90 days supply | Qty: 90 | Fill #1

## 2018-07-07 MED FILL — ATORVASTATIN CALCIUM 20 MG: 20 | 90 days supply | Qty: 90 | Fill #0

## 2018-07-28 ENCOUNTER — Ambulatory Visit (INDEPENDENT_AMBULATORY_CARE_PROVIDER_SITE_OTHER): Payer: 59 | Admitting: Family Medicine

## 2018-07-28 DIAGNOSIS — Z23 Encounter for immunization: Secondary | ICD-10-CM

## 2018-09-27 IMAGING — US US PELVIS COMPLETE
1 series · 15 of 25 positions shown · non-contrast
Comparison: None

CLINICAL DATA: Menorrhagia with regular menstrual cycle



[Series 1: us pelvis complete · 15 of 42 slices shown]
[im 1/42]
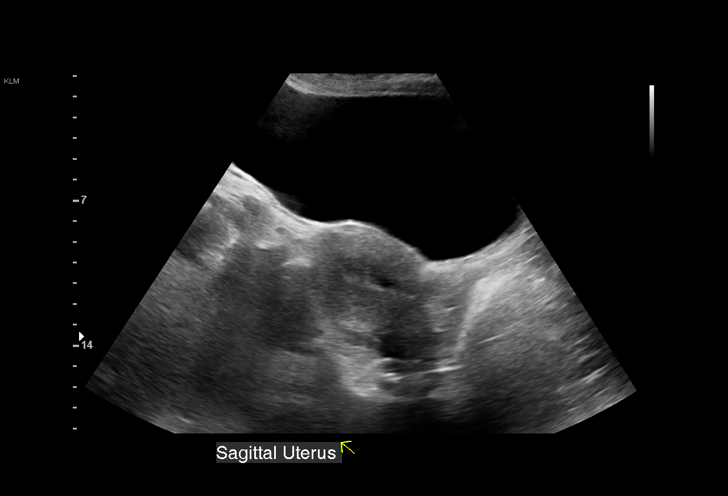
[im 4/42]
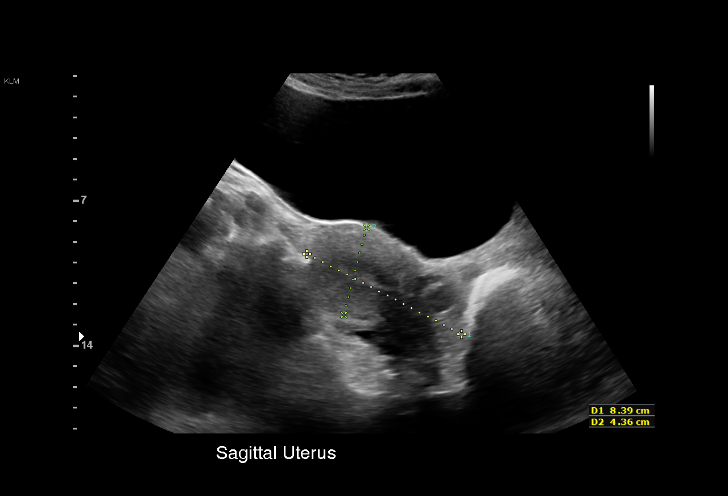
[im 7/42]
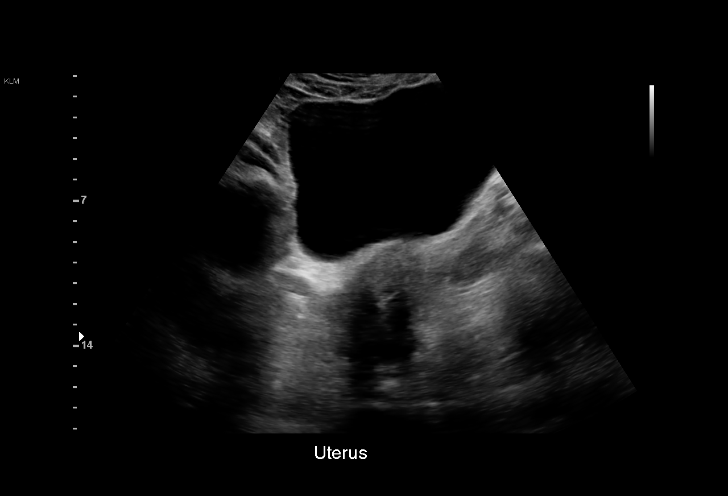
[im 9/42]
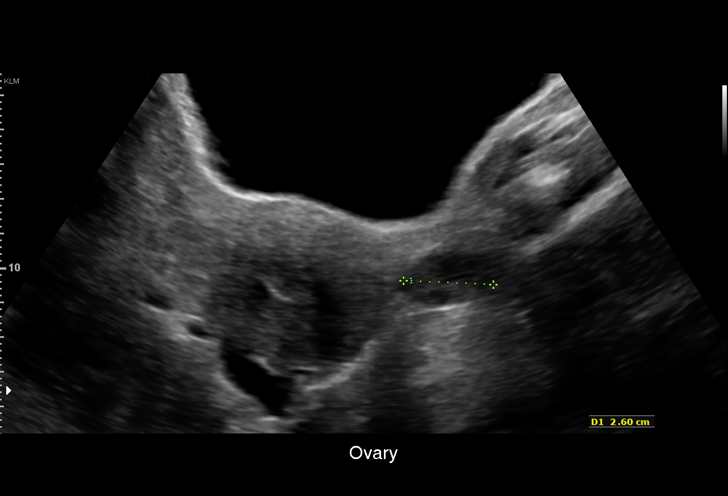
[im 12/42]
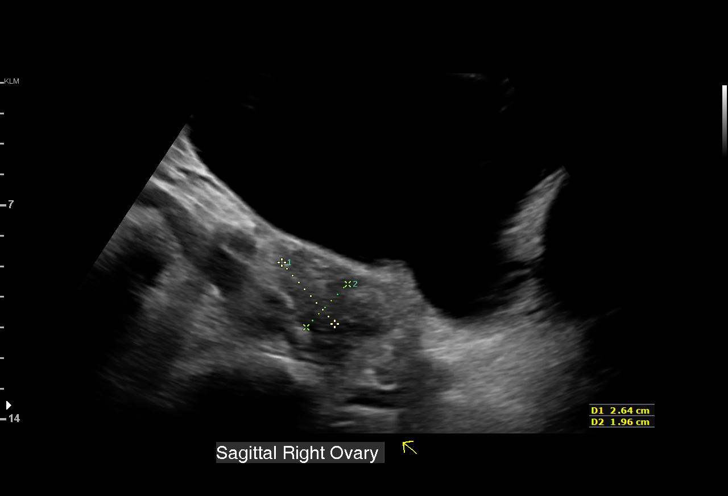
[im 16/42]
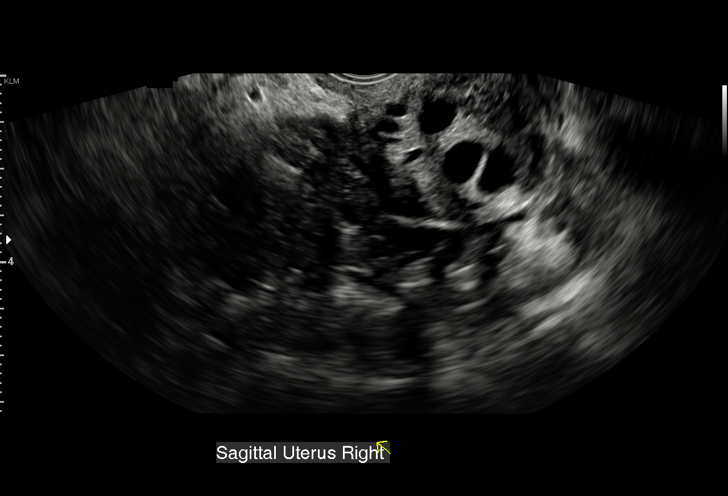
[im 18/42]
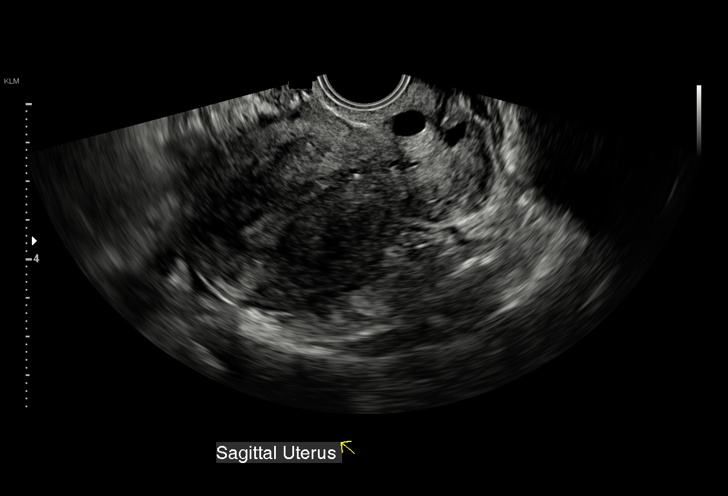
[im 21/42]
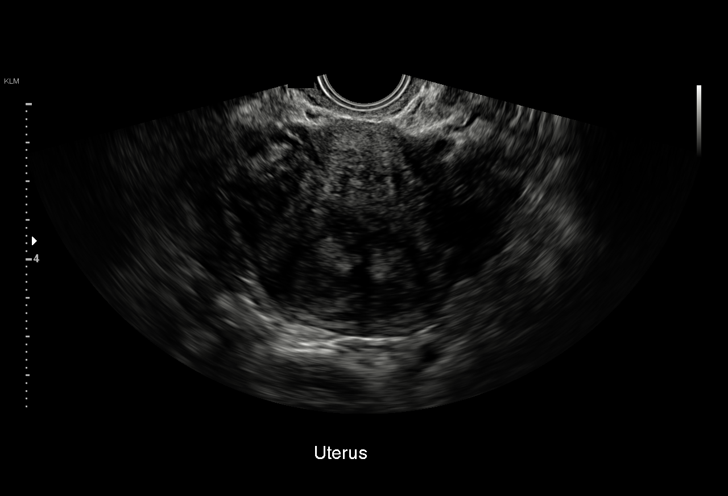
[im 24/42]
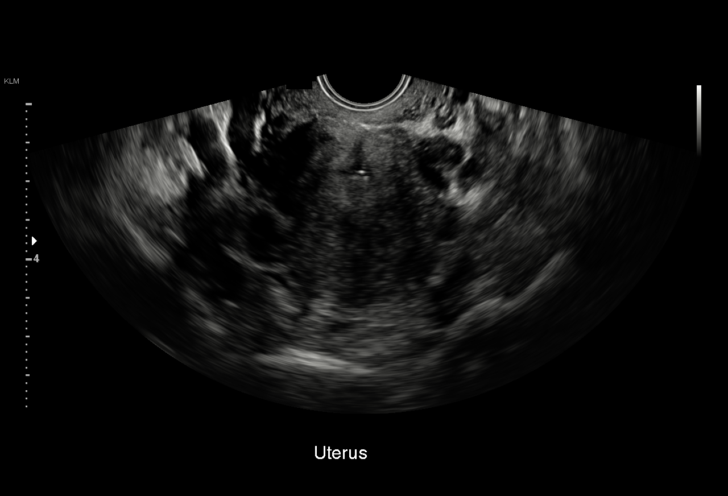
[im 26/42]
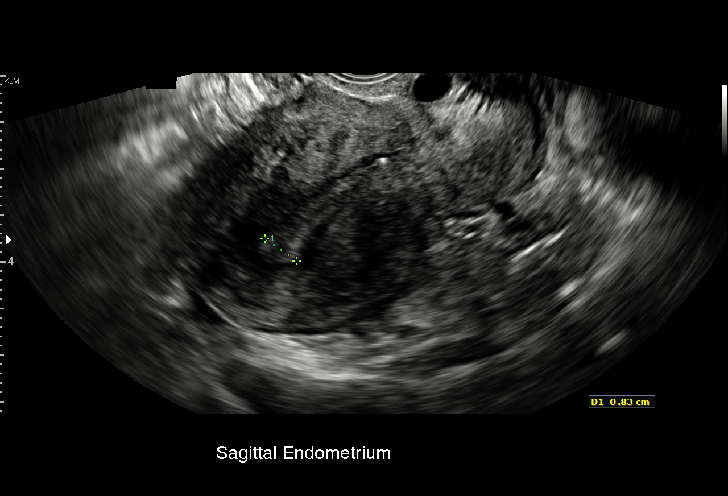
[im 30/42]
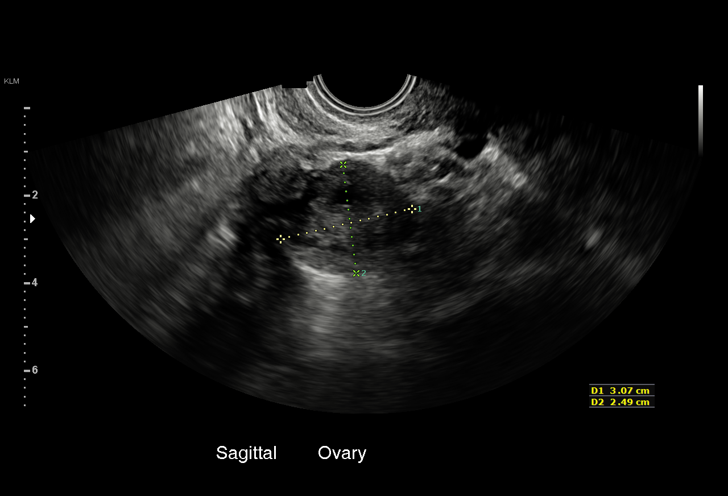
[im 33/42]
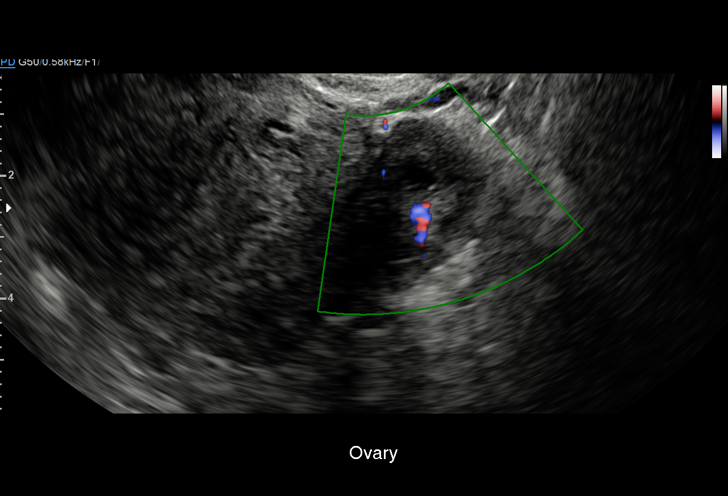
[im 35/42]
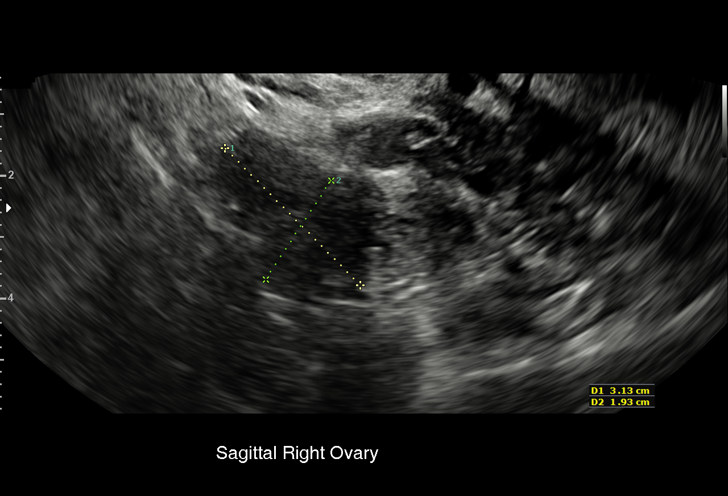
[im 38/42]
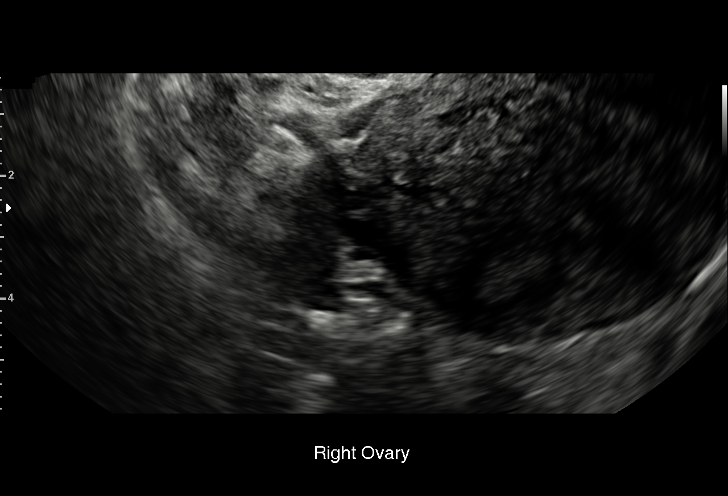
[im 42/42]
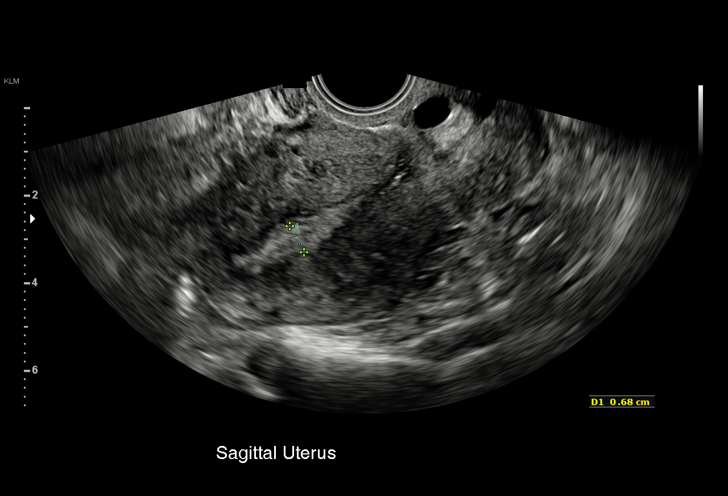

[15 of 25 positions shown; findings below may reference images not displayed]

FINDINGS: Uterus

Measurements: 8.4 x 5.0 x 5.8 cm. Suspected arcuate uterus best
demonstrated on 3D imaging. No focal uterine mass.

Endometrium

Thickness: 8 mm thick, normal. No endometrial fluid or focal
abnormality

Right ovary

Measurements: 3.1 x 1.9 x 1.4 cm. Normal morphology without mass

Left ovary

Measurements: 3.1 x 2.5 x 1.9 cm. Normal morphology without mass

Other findings

No free fluid or adnexal masses.
IMPRESSION: No acute pelvic abnormalities.

Suspected arcuate uterus.

## 2018-10-04 ENCOUNTER — Other Ambulatory Visit: Payer: Self-pay | Admitting: Family Medicine

## 2018-10-04 MED FILL — PANTOPRAZOLE SOD DR 40 MG T: 40 | 90 days supply | Qty: 90 | Fill #2

## 2018-10-04 MED FILL — ATORVASTATIN CALCIUM 20 MG: 20 | 90 days supply | Qty: 90 | Fill #0

## 2018-10-04 MED FILL — DULoxetine HCL 30 MG CPEP: 30 | 90 days supply | Qty: 90 | Fill #2

## 2018-11-25 ENCOUNTER — Telehealth: Payer: 59 | Admitting: Physician Assistant

## 2018-11-25 DIAGNOSIS — J208 Acute bronchitis due to other specified organisms: Secondary | ICD-10-CM | POA: Diagnosis not present

## 2018-11-25 MED ORDER — BENZONATATE 100 MG PO CAPS: 100.0000 mg | ORAL_CAPSULE | Freq: Three times a day (TID) | ORAL | 0 refills | Status: DC | PRN

## 2018-11-25 NOTE — Progress Notes (Signed)

## 2018-11-25 NOTE — Progress Notes (Signed)
I have spent 5 minutes in review of patient chart, questionnaire and time for medical decision making and response to the patient.   Leeanne Rio, PA-C

## 2018-11-30 ENCOUNTER — Ambulatory Visit (INDEPENDENT_AMBULATORY_CARE_PROVIDER_SITE_OTHER): Payer: 59 | Admitting: Family Medicine

## 2018-11-30 ENCOUNTER — Encounter: Payer: Self-pay | Admitting: Family Medicine

## 2018-11-30 VITALS — BP 131/72 | HR 75 | Temp 98.5°F | Ht 61.0 in | Wt 146.0 lb

## 2018-11-30 DIAGNOSIS — R05 Cough: Secondary | ICD-10-CM

## 2018-11-30 DIAGNOSIS — R059 Cough, unspecified: Secondary | ICD-10-CM

## 2018-11-30 MED ORDER — LEVOFLOXACIN 500 MG PO TABS: 500.0000 mg | ORAL_TABLET | Freq: Every day | ORAL | 0 refills | Status: DC

## 2018-11-30 MED ORDER — IPRATROPIUM BROMIDE 0.06 % NA SOLN: 2.0000 | NASAL | 6 refills | Status: AC | PRN

## 2018-11-30 MED ORDER — MOMETASONE FURO-FORMOTEROL FUM 200-5 MCG/ACT IN AERO: 2.0000 | INHALATION_SPRAY | Freq: Two times a day (BID) | RESPIRATORY_TRACT | 1 refills | Status: DC | PRN

## 2018-11-30 MED FILL — DULERA 200 MCG/5 MCG INH: 200-5 | 30 days supply | Qty: 13 | Fill #0

## 2018-11-30 MED FILL — IPRATROPIUM 0.06% SPRAY: 0.06 | 10 days supply | Qty: 15 | Fill #0

## 2018-11-30 MED FILL — levoFLOXacin 500 MG TABS: 500 | 7 days supply | Qty: 7 | Fill #0

## 2018-11-30 NOTE — Progress Notes (Signed)
Julie Burton is a 58 y.o. female who presents to [facility] today for cough congestion wheezing runny nose.  Symptoms have been ongoing for about 5 weeks now.  Patient had an ED visit on February 13 where she was thought to have viral cough and prescribed Tessalon Perles as well as recommended over-the-counter medications.  She additionally has been using her albuterol inhaler which helps temporarily.  She is had symptoms like this in the past and did well with Levaquin.  No chest pain palpitations or shortness of breath.  Patient does note some fatigue with her recent illness.  She notes that her mother had frequent episodes of bronchitis then developed COPD and she is worried she may have developed that or be in the process of developing it.  She has no risk factors.  She has never smoked.  Julie Burton also has a history of iron deficiency anemia due to menstrual bleeding.  She is currently taking iron daily and feels well. ROS as above:  Exam:  BP 131/72   Pulse 75   Temp 98.5 F (36.9 C) (Oral)   Ht 5\' 1"  (1.549 m)   Wt 146 lb (66.2 kg)   SpO2 99%   BMI 27.59 kg/m  Wt Readings from Last 5 Encounters:  11/30/18 146 lb (66.2 kg)  05/10/18 140 lb (63.5 kg)  03/29/18 140 lb (63.5 kg)  12/14/17 144 lb (65.3 kg)  03/31/17 139 lb (63 kg)    Gen: Well NAD HEENT: EOMI,  MMM clear nasal discharge.  Posterior pharynx with cobblestoning.  Normal tympanic membranes.  Minimal cervical lymphadenopathy. Lungs: Normal work of breathing. CTABL Heart: RRR no MRG Abd: NABS, Soft. Nondistended, Nontender Exts: Brisk capillary refill, warm and well perfused.   Lab and Radiology Results No results found for this or any previous visit (from the past 72 hour(s)). No results found.    Assessment and Plan: 58 y.o. female with cough with likely bronchitis.  Question second  sickening.  Reasonable to treat with Levaquin as that is worked well in the past.  Additionally will use Dulera inhaler as she is had some benefit with albuterol.  She may benefit from an inhaled steroid component.  We use Atrovent nasal spray as well.  If not improving neck step would likely be further work-up and possibly also treatment with oral steroids.  Patient will be due for recheck physical this summer.  We will arrange for spirometry at the same time.  PDMP not reviewed this encounter. No orders of the defined types were placed in this encounter.  Meds ordered this encounter  Medications  . levofloxacin (LEVAQUIN) 500 MG tablet    Sig: Take 1 tablet (500 mg total) by mouth daily.    Dispense:  7 tablet    Refill:  0  . mometasone-formoterol (DULERA) 200-5 MCG/ACT AERO    Sig: Inhale 2 puffs into the lungs 2 (two) times daily as needed for wheezing.    Dispense:  1 Inhaler    Refill:  1  . ipratropium (ATROVENT) 0.06 % nasal spray    Sig: Place 2 sprays into both nostrils every 4 (four) hours as needed.    Dispense:  10 mL    Refill:  6     Historical information moved to improve visibility of documentation.  Past Medical History:  Diagnosis Date  . Anemia   . Arthritis   . GERD (gastroesophageal reflux disease)   .  IBS (irritable colon syndrome) 03/31/2017   Constipation dominant  . Vaginal Pap smear, abnormal    HPV   Past Surgical History:  Procedure Laterality Date  . BUNIONECTOMY  2008  . CESAREAN SECTION    . COLPOSCOPY    . NISSEN FUNDOPLICATION    . TONSILLECTOMY    . TUBAL LIGATION     Social History   Tobacco Use  . Smoking status: Never Smoker  . Smokeless tobacco: Never Used  Substance Use Topics  . Alcohol use: No    Alcohol/week: 0.0 standard drinks   family history includes COPD in her mother; Cancer in her mother; Depression in her mother; Diabetes in her mother; Heart disease in her mother and paternal grandfather; Hyperlipidemia in her  mother; Hypertension in her mother; Miscarriages / Stillbirths in her paternal grandmother.  Medications: Current Outpatient Medications  Medication Sig Dispense Refill  . acetaminophen (TYLENOL) 650 MG CR tablet Take 2 tablets (1,300 mg total) by mouth every 8 (eight) hours as needed for pain. 90 tablet 3  . albuterol (PROVENTIL HFA;VENTOLIN HFA) 108 (90 Base) MCG/ACT inhaler Inhale 2 puffs into the lungs every 6 (six) hours as needed for wheezing or shortness of breath. 1 Inhaler 0  . atorvastatin (LIPITOR) 20 MG tablet TAKE 1 TABLET BY MOUTH DAILY. 90 tablet 0  . benzonatate (TESSALON) 100 MG capsule Take 1 capsule (100 mg total) by mouth 3 (three) times daily as needed for cough. 30 capsule 0  . Biotin 5 MG CAPS     . Cholecalciferol (VITAMIN D) 2000 units CAPS     . diclofenac sodium (VOLTAREN) 1 % GEL Apply 2 g topically 4 (four) times daily. To affected joint. 100 g 11  . DULoxetine (CYMBALTA) 30 MG capsule Take 1 capsule (30 mg total) by mouth daily. 90 capsule 3  . Ferrous Bisglycinate Chelate 15 MG TABS Take 1 tablet by mouth 3 (three) times daily. 30 tablet   . pantoprazole (PROTONIX) 40 MG tablet Take 1 tablet (40 mg total) by mouth daily. 90 tablet 3  . Saccharomyces boulardii (PROBIOTIC) 250 MG CAPS Take 1 capsule by mouth daily. 14 capsule   . ipratropium (ATROVENT) 0.06 % nasal spray Place 2 sprays into both nostrils every 4 (four) hours as needed. 10 mL 6  . levofloxacin (LEVAQUIN) 500 MG tablet Take 1 tablet (500 mg total) by mouth daily. 7 tablet 0  . mometasone-formoterol (DULERA) 200-5 MCG/ACT AERO Inhale 2 puffs into the lungs 2 (two) times daily as needed for wheezing. 1 Inhaler 1   No current facility-administered medications for this visit.    No Known Allergies   Discussed warning signs or symptoms. Please see discharge instructions. Patient expresses understanding.

## 2018-11-30 NOTE — Patient Instructions (Addendum)
Thank you for coming in today. Continue over the counter medicines as needed.  Take levaquin daily for 7 days.  Try using the dulera inhailler twice daily for 1-2 weeks then as needed.   Recheck with me for physical and spirometry in about 6 months.   If not improving let me know we can send in prednisone   Call or go to the emergency room if you get worse, have trouble breathing, have chest pains, or palpitations.

## 2019-01-05 ENCOUNTER — Other Ambulatory Visit: Payer: Self-pay | Admitting: Family Medicine

## 2019-01-05 MED FILL — PANTOPRAZOLE SOD DR 40 MG T: 40 | 90 days supply | Qty: 90 | Fill #0

## 2019-01-05 MED FILL — DULoxetine HCL 30 MG CPEP: 30 | 90 days supply | Qty: 90 | Fill #0

## 2019-01-05 MED FILL — ATORVASTATIN 20 MG TABLET: 20 | 90 days supply | Qty: 90 | Fill #0

## 2019-02-15 DIAGNOSIS — H524 Presbyopia: Secondary | ICD-10-CM | POA: Diagnosis not present

## 2019-03-09 ENCOUNTER — Encounter: Payer: Self-pay | Admitting: Family Medicine

## 2019-03-09 ENCOUNTER — Ambulatory Visit (INDEPENDENT_AMBULATORY_CARE_PROVIDER_SITE_OTHER): Payer: 59 | Admitting: Family Medicine

## 2019-03-09 VITALS — BP 133/81 | HR 69 | Temp 98.3°F | Wt 151.0 lb

## 2019-03-09 DIAGNOSIS — G5603 Carpal tunnel syndrome, bilateral upper limbs: Secondary | ICD-10-CM

## 2019-03-09 DIAGNOSIS — M6740 Ganglion, unspecified site: Secondary | ICD-10-CM

## 2019-03-09 DIAGNOSIS — M65319 Trigger thumb, unspecified thumb: Secondary | ICD-10-CM | POA: Diagnosis not present

## 2019-03-09 DIAGNOSIS — M7661 Achilles tendinitis, right leg: Secondary | ICD-10-CM

## 2019-03-09 NOTE — Progress Notes (Signed)
Julie Burton is a 58 y.o. female who presents to [place]: [place] today for bilateral hand and thumb pain.  Julie Burton has a history of bilateral carpal tunnel syndrome.  In [place] several years ago she reports having a nerve conduction study that was moderate.  Her symptoms have been ongoing for years.  She has noted that her symptoms are typically pretty well managed with cock-up wrist splints however they have been worsening slowly over the last year.  She notes bothersome numbness and tingling into her hands bilaterally especially at night.  Additionally she notes bothersome thumb pain and triggering.  She feels as though her thumbs become stuck in the flexed position with popping.  She notes this is painful.  This is worsened recently also without any injury.  She is right-hand dominant and thinks that her right hand is worse than her left.  Additionally she notes bilateral heel tightness and pain.  She thinks she has a case of mild Achilles tendinitis.  She is been doing some home exercises including stretching. ROS as above:  Exam:  BP 133/81   Pulse 69   Temp 98.3 F (36.8 C) (Oral)   Wt 151 lb (68.5 kg)   BMI 28.53 kg/m  Wt Readings from Last 5 Encounters:  03/09/19 151 lb (68.5 kg)  11/30/18 146 lb (66.2 kg)  05/10/18 140 lb (63.5 kg)  03/29/18 140 lb (63.5 kg)  12/14/17 144 lb (65.3 kg)    Gen: Well NAD HEENT: EOMI,  MMM Lungs: Normal work of breathing. CTABL Heart: RRR no MRG Abd: NABS, Soft. Nondistended, Nontender Exts: Brisk capillary refill, warm and well perfused.  Right hand relatively normal-appearing with no significant swelling or deformity. Tender palpation thumb MCP with triggering present with flexion of IP joint.  Strength is intact. Hand is otherwise normal.  Grip strength intact Right wrist normal-appearing.  Positive Tinel's carpal  tunnel.  Left hand normal-appearing tender to palpation thumb MCP.  Triggering present with flexion of IP joint.  Hand is otherwise normal with normal strength and motion. Left wrist with positive Tinel's at carpal tunnel.  Posterior heels and calves normal-appearing nontender.  Normal motion.  Lab and Radiology Results Limited musculoskeletal ultrasound right hand and right wrist. Right thumb MCP flexor tendon reveals small ganglion cyst at A1 pulley.  Flexor tendon is otherwise normal-appearing Carpal tunnel shows enlarged median nerve with a cross-sectional area of 20 mm.  Bony structures are normal. Impression: Trigger finger right thumb with ganglion cyst and carpal tunnel syndrome.  Procedure: Real-time Ultrasound Guided Injection of right thumb trigger finger Device: GE Logiq E   Images permanently stored and available for review in the ultrasound unit. Verbal informed consent obtained.  Discussed risks and benefits of procedure. Warned about infection bleeding damage to structures skin hypopigmentation and fat atrophy among others. Patient expresses understanding and agreement Time-out conducted.   Noted no overlying erythema, induration, or other signs of local infection.   Skin prepped in a sterile fashion.   Local anesthesia: Topical Ethyl chloride.   With sterile technique and under real time ultrasound guidance:  0.5 mL of lidocaine and 40 mg of Depo-Medrol injected easily into tendon sheath and ganglion cyst associated with tendon sheath.   Completed without difficulty   Pain immediately resolved suggesting accurate placement of the medication.   Advised to call if fevers/chills, erythema, induration, drainage, or persistent bleeding.   Images permanently stored and available for review  in the ultrasound unit.  Impression: Technically successful ultrasound guided injection.   Procedure: Real-time Ultrasound Guided right wrist median nerve hydrodissection right carpal  tunnel Device: GE Logiq E   Images permanently stored and available for review in the ultrasound unit. Verbal informed consent obtained.  Discussed risks and benefits of procedure. Warned about infection bleeding damage to structures skin hypopigmentation and fat atrophy among others. Patient expresses understanding and agreement Time-out conducted.   Noted no overlying erythema, induration, or other signs of local infection.   Skin prepped in a sterile fashion.   Local anesthesia: Topical Ethyl chloride.   With sterile technique and under real time ultrasound guidance:  40 mg of Depo-Medrol and 2 mL of lidocaine injected easily.   Completed without difficulty   Pain immediately resolved suggesting accurate placement of the medication.   Advised to call if fevers/chills, erythema, induration, drainage, or persistent bleeding.   Images permanently stored and available for review in the ultrasound unit.  Impression: Technically successful ultrasound guided injection.    Assessment and Plan: 58 y.o. female with  Right carpal tunnel syndrome: Self-reported abnormal nerve conduction study in the past as well as enlarged median nerve ultrasound diagnostic for carpal tunnel syndrome.  Patient had ultrasound-guided median nerve hydrodissection and will proceed with wrist splinting.  If not improving next up would likely be surgical consultation as this is been an ongoing process for quite some time now.  Additionally patient has trigger finger of the right thumb.  She also has a ganglion cyst associated with the tendon sheath.  This was injected today following the carpal tunnel injection.  Plan for double Band-Aid splint and relative rest.  Again if not improving would be reasonable to have trigger finger release while having carpal tunnel release.  Left-sided symptoms: Same is right however will delay injection on the left until about a week or 2 from now to give the steroids time to diminish.   Heel pain: Mild Achilles tendinitis.  Plan for eccentric exercises.  Recheck if not improving.  PDMP not reviewed this encounter. No orders of the defined types were placed in this encounter.  No orders of the defined types were placed in this encounter.    Historical information moved to improve visibility of documentation.  Past Medical History:  Diagnosis Date  . Anemia   . Arthritis   . GERD (gastroesophageal reflux disease)   . IBS (irritable colon syndrome) 03/31/2017   Constipation dominant  . Vaginal Pap smear, abnormal    HPV   Past Surgical History:  Procedure Laterality Date  . BUNIONECTOMY  2008  . CESAREAN SECTION    . COLPOSCOPY    . NISSEN FUNDOPLICATION    . TONSILLECTOMY    . TUBAL LIGATION     Social History   Tobacco Use  . Smoking status: Never Smoker  . Smokeless tobacco: Never Used  Substance Use Topics  . Alcohol use: No    Alcohol/week: 0.0 standard drinks   family history includes COPD in her mother; Cancer in her mother; Depression in her mother; Diabetes in her mother; Heart disease in her mother and paternal grandfather; Hyperlipidemia in her mother; Hypertension in her mother; Miscarriages / Stillbirths in her paternal grandmother.  Medications: Current Outpatient Medications  Medication Sig Dispense Refill  . acetaminophen (TYLENOL) 650 MG CR tablet Take 2 tablets (1,300 mg total) by mouth every 8 (eight) hours as needed for pain. 90 tablet 3  . albuterol (PROVENTIL HFA;VENTOLIN HFA) 108 (90  Base) MCG/ACT inhaler Inhale 2 puffs into the lungs every 6 (six) hours as needed for wheezing or shortness of breath. 1 Inhaler 0  . atorvastatin (LIPITOR) 20 MG tablet TAKE 1 TABLET BY MOUTH DAILY. 90 tablet 0  . Biotin 5 MG CAPS     . Cholecalciferol (VITAMIN D) 2000 units CAPS     . diclofenac sodium (VOLTAREN) 1 % GEL Apply 2 g topically 4 (four) times daily. To affected joint. 100 g 11  . DULoxetine (CYMBALTA) 30 MG capsule Take 1 capsule (30 mg  total) by mouth daily. 90 capsule 3  . Ferrous Bisglycinate Chelate 15 MG TABS Take 1 tablet by mouth 3 (three) times daily. 30 tablet   . ipratropium (ATROVENT) 0.06 % nasal spray Place 2 sprays into both nostrils every 4 (four) hours as needed. 10 mL 6  . mometasone-formoterol (DULERA) 200-5 MCG/ACT AERO Inhale 2 puffs into the lungs 2 (two) times daily as needed for wheezing. 1 Inhaler 1  . pantoprazole (PROTONIX) 40 MG tablet Take 1 tablet (40 mg total) by mouth daily. 90 tablet 3  . Saccharomyces boulardii (PROBIOTIC) 250 MG CAPS Take 1 capsule by mouth daily. 14 capsule    No current facility-administered medications for this visit.    No Known Allergies   Discussed warning signs or symptoms. Please see discharge instructions. Patient expresses understanding.

## 2019-03-09 NOTE — Patient Instructions (Signed)
Thank you for coming in today. Keep me updated on how you feel after injections.  Next step is left hand injection anytime after 1 week.  Use the wrist brace an double bandaid splint.   Call or go to the ER if you develop a large red swollen joint with extreme pain or oozing puss.

## 2019-03-16 ENCOUNTER — Encounter: Payer: Self-pay | Admitting: Family Medicine

## 2019-03-16 ENCOUNTER — Ambulatory Visit (INDEPENDENT_AMBULATORY_CARE_PROVIDER_SITE_OTHER): Payer: 59 | Admitting: Family Medicine

## 2019-03-16 VITALS — BP 143/78 | HR 74 | Temp 98.1°F | Wt 150.0 lb

## 2019-03-16 DIAGNOSIS — G5603 Carpal tunnel syndrome, bilateral upper limbs: Secondary | ICD-10-CM

## 2019-03-16 NOTE — Patient Instructions (Signed)
Thank you for coming in today. Call or go to the ER if you develop a large red swollen joint with extreme pain or oozing puss.  If this reoccurs next step is getting you ready for surgery.  We will do a nerve conduction study and then refer to hand surgery.  Dr Amedeo Plenty at [facility]now [facility] Or Dr Fredna Dow.   Keep me updated.   Consider Livalo for statins.  See how you do off Lipitor for a while.  Next step if we can is to try Livalo or Pravastatin.

## 2019-03-16 NOTE — Progress Notes (Signed)
Patient presents to clinic today for previously arranged left carpal tunnel and left trigger thumb injections.  She was seen on May 27 and had right-sided injections.  We delayed the left-sided injections until today to reduce chances of steroid side effects.  She would like to proceed with left-sided carpal tunnel injection today.  She notes her left-sided trigger thumb is not as bad and she had some pain following the injection of her right side.  Overall she thinks her right side is much better controlled.  Procedure: Real-time Ultrasound Guided left median nerve hydrodissection carpal tunnel Device: GE Logiq E   Images permanently stored and available for review in the ultrasound unit. Verbal informed consent obtained.  Discussed risks and benefits of procedure. Warned about infection bleeding damage to structures skin hypopigmentation and fat atrophy among others. Patient expresses understanding and agreement Time-out conducted.   Noted no overlying erythema, induration, or other signs of local infection.   Skin prepped in a sterile fashion.   Local anesthesia: Topical Ethyl chloride.   With sterile technique and under real time ultrasound guidance:  40 mg of Depo-Medrol and 2 mL of lidocaine injected easily.   Completed without difficulty   Pain immediately resolved suggesting accurate placement of the medication.   Advised to call if fevers/chills, erythema, induration, drainage, or persistent bleeding.   Images permanently stored and available for review in the ultrasound unit.  Impression: Technically successful ultrasound guided injection.

## 2019-04-01 ENCOUNTER — Other Ambulatory Visit: Payer: Self-pay | Admitting: Family Medicine

## 2019-04-01 DIAGNOSIS — K2100 Gastro-esophageal reflux disease with esophagitis, without bleeding: Secondary | ICD-10-CM

## 2019-04-01 MED FILL — DULoxetine HCL 30 MG CPEP: 30 | 90 days supply | Qty: 90 | Fill #0

## 2019-04-01 MED FILL — PANTOPRAZOLE SOD DR 40 MG T: 40 | 90 days supply | Qty: 90 | Fill #0

## 2019-05-31 ENCOUNTER — Encounter: Payer: Self-pay | Admitting: Family Medicine

## 2019-05-31 ENCOUNTER — Ambulatory Visit (INDEPENDENT_AMBULATORY_CARE_PROVIDER_SITE_OTHER): Payer: 59 | Admitting: Family Medicine

## 2019-05-31 ENCOUNTER — Ambulatory Visit: Payer: 59 | Admitting: Family Medicine

## 2019-05-31 ENCOUNTER — Other Ambulatory Visit: Payer: Self-pay

## 2019-05-31 VITALS — BP 129/76 | HR 82 | Ht 61.0 in | Wt 147.0 lb

## 2019-05-31 VITALS — BP 129/76 | HR 82 | Wt 147.0 lb

## 2019-05-31 DIAGNOSIS — R7303 Prediabetes: Secondary | ICD-10-CM

## 2019-05-31 DIAGNOSIS — E785 Hyperlipidemia, unspecified: Secondary | ICD-10-CM

## 2019-05-31 DIAGNOSIS — Z1239 Encounter for other screening for malignant neoplasm of breast: Secondary | ICD-10-CM

## 2019-05-31 DIAGNOSIS — R942 Abnormal results of pulmonary function studies: Secondary | ICD-10-CM | POA: Diagnosis not present

## 2019-05-31 DIAGNOSIS — Z6827 Body mass index (BMI) 27.0-27.9, adult: Secondary | ICD-10-CM

## 2019-05-31 DIAGNOSIS — D500 Iron deficiency anemia secondary to blood loss (chronic): Secondary | ICD-10-CM

## 2019-05-31 DIAGNOSIS — R05 Cough: Secondary | ICD-10-CM | POA: Diagnosis not present

## 2019-05-31 DIAGNOSIS — Z0000 Encounter for general adult medical examination without abnormal findings: Secondary | ICD-10-CM

## 2019-05-31 DIAGNOSIS — E559 Vitamin D deficiency, unspecified: Secondary | ICD-10-CM

## 2019-05-31 DIAGNOSIS — R059 Cough, unspecified: Secondary | ICD-10-CM

## 2019-05-31 LAB — PULMONARY FUNCTION TEST

## 2019-05-31 MED ORDER — PRAVASTATIN SODIUM 40 MG PO TABS: 40.0000 mg | ORAL_TABLET | Freq: Every day | ORAL | 1 refills | Status: DC

## 2019-05-31 MED ORDER — ALBUTEROL SULFATE HFA 108 (90 BASE) MCG/ACT IN AERS: 4.0000 | INHALATION_SPRAY | Freq: Once | RESPIRATORY_TRACT | Status: AC

## 2019-05-31 MED FILL — PRAVASTATIN NA 40 MG TAB: 40 | 90 days supply | Qty: 90 | Fill #0

## 2019-05-31 NOTE — Progress Notes (Signed)
Julie Burton is a 58 y.o. female who presents to [place]: [place] today for well adult visit.   Julie Burton is doing well overall.  She has had some shortness of breath this issues thought to be related to asthma.  She currently takes [place].  She had spirometry earlier today and had interpretation.  Her spirometry is effectively normal with the exception of PEF which was at about 50% of predicted value.  She had 17% improvement post nebulizer.  She notes that she feels that she is pretty healthy.  She continues to exercise regularly and works as a Marine scientist in inpatient.  She recognizes that she is a little bit heavier than she should be.  Her goal weight is about 125 pounds.  She is trying to eat a careful diet but notes that sometimes she has snacking in the evenings.  She is interested in repeating mammogram.  Her last mammogram was about a year ago.  She is up-to-date with other health maintenance items including colonoscopy done in [place] in 2013 by [facility].  It was reportedly normal.  Fax number to the medical records office is [phone].  Direct phone number is [phone]  She also has a pertinent medical history for hyperlipidemia.  She was started on Lipitor and had significant improvement in her lipids however had significant myalgias and the medication was discontinued.  Her myalgias improved after stopping Lipitor.  She is currently not on any statin medication.     ROS as above:  Past Medical History:  Diagnosis Date  . Anemia   . Arthritis   . GERD (gastroesophageal reflux disease)   . IBS (irritable colon syndrome) 03/31/2017   Constipation dominant  . Vaginal Pap smear, abnormal    HPV   Past Surgical History:  Procedure Laterality Date  . BUNIONECTOMY  2008  . CESAREAN SECTION    . COLPOSCOPY    . NISSEN FUNDOPLICATION     . TONSILLECTOMY    . TUBAL LIGATION     Social History   Tobacco Use  . Smoking status: Never Smoker  . Smokeless tobacco: Never Used  Substance Use Topics  . Alcohol use: No    Alcohol/week: 0.0 standard drinks   family history includes COPD in her mother; Cancer in her mother; Depression in her mother; Diabetes in her mother; Heart disease in her mother and paternal grandfather; Hyperlipidemia in her mother; Hypertension in her mother; Miscarriages / Stillbirths in her paternal grandmother.  Medications: Current Outpatient Medications  Medication Sig Dispense Refill  . acetaminophen (TYLENOL) 650 MG CR tablet Take 2 tablets (1,300 mg total) by mouth every 8 (eight) hours as needed for pain. 90 tablet 3  . albuterol (PROVENTIL HFA;VENTOLIN HFA) 108 (90 Base) MCG/ACT inhaler Inhale 2 puffs into the lungs every 6 (six) hours as needed for wheezing or shortness of breath. 1 Inhaler 0  . Cholecalciferol (VITAMIN D) 2000 units CAPS     . diclofenac sodium (VOLTAREN) 1 % GEL Apply 2 g topically 4 (four) times daily. To affected joint. 100 g 11  . DULoxetine (CYMBALTA) 30 MG capsule TAKE 1 CAPSULE BY MOUTH DAILY. 90 capsule 0  . Ferrous Bisglycinate Chelate 15 MG TABS Take 1 tablet by mouth 3 (three) times daily. 30 tablet   . ipratropium (ATROVENT) 0.06 % nasal spray Place 2 sprays into both nostrils every 4 (four) hours as needed. 10 mL 6  . mometasone-formoterol (  DULERA) 200-5 MCG/ACT AERO Inhale 2 puffs into the lungs 2 (two) times daily as needed for wheezing. 1 Inhaler 1  . pantoprazole (PROTONIX) 40 MG tablet TAKE 1 TABLET BY MOUTH DAILY. 90 tablet 0  . Saccharomyces boulardii (PROBIOTIC) 250 MG CAPS Take 1 capsule by mouth daily. 14 capsule   . pravastatin (PRAVACHOL) 40 MG tablet Take 1 tablet (40 mg total) by mouth daily. 90 tablet 1   Current Facility-Administered Medications  Medication Dose Route Frequency Provider Last Rate Last Dose  . albuterol (VENTOLIN HFA) 108 (90 Base)  MCG/ACT inhaler 4 puff  4 puff Inhalation Once Gregor Hams, MD       No Known Allergies  Health Maintenance Health Maintenance  Topic Date Due  . INFLUENZA VACCINE  05/14/2019  . MAMMOGRAM  04/21/2020  . PAP SMEAR-Modifier  05/10/2021  . TETANUS/TDAP  01/11/2022  . COLONOSCOPY  07/31/2022  . Hepatitis C Screening  Completed  . HIV Screening  Completed     Exam:  BP 129/76 (Patient Position: Sitting, Cuff Size: Normal)   Pulse 82   Wt 147 lb (66.7 kg)   BMI 27.78 kg/m  Wt Readings from Last 5 Encounters:  05/31/19 147 lb (66.7 kg)  05/31/19 147 lb (66.7 kg)  03/16/19 150 lb (68 kg)  03/09/19 151 lb (68.5 kg)  11/30/18 146 lb (66.2 kg)      Gen: Well NAD HEENT: EOMI,  MMM Lungs: Normal work of breathing. CTABL Heart: RRR no MRG Abd: NABS, Soft. Nondistended, Nontender Exts: Brisk capillary refill, warm and well perfused.  Psych: Alert and oriented normal speech thought process and affect  Depression screen [facility] 2/9 11/30/2018 03/29/2018 03/31/2017 03/31/2017 03/06/2016  Decreased Interest 0 1 0 0 0  Down, Depressed, Hopeless 0 0 0 1 0  PHQ - 2 Score 0 1 0 1 0    Spirometry: As noted above effectively normal with the exception of 54% predicted PEF. Spirometry will be sent to scan         Assessment and Plan: 58 y.o. female with well adult.  Doing reasonably well.  We will arrange for repeat mammogram and obtain records from colonoscopy in 2013 via records request.  Discussed weight management and exercise plan.  Plan to start pravastatin for hyperlipidemia.  Will check labs in about 6 weeks as listed below.  If intolerant of pravastatin patient will discontinue on her own and let me know.  Regarding her spirometry.  It was abnormal as below.  She is symptomatic.  I think it is reasonable at this point to refer to pulmonology for further evaluation management.  Spirometry results will be sent to scan.   Additionally informed patient that I will be  transitioning to sports medicine only and [place] started in November.  Discussed new PCP options.  Happy to continue seeing her for sports medicine related issues such as carpal tunnel syndrome.  PDMP not reviewed this encounter. Orders Placed This Encounter  Procedures  . MM 3D SCREEN BREAST BILATERAL    Standing Status:   Future    Standing Expiration Date:   07/30/2020    Order Specific Question:   Reason for Exam (SYMPTOM  OR DIAGNOSIS REQUIRED)    Answer:   screen breast cancer    Order Specific Question:   Is the patient pregnant?    Answer:   No    Order Specific Question:   Preferred imaging location?    Answer:   [facility]  . CBC  .  COMPLETE METABOLIC PANEL WITH GFR  . Lipid Panel w/reflex Direct LDL  . Hemoglobin A1c  . Ambulatory referral to Pulmonology    Referral Priority:   Routine    Referral Type:   Consultation    Referral Reason:   Specialty Services Required    Requested Specialty:   Pulmonary Disease    Number of Visits Requested:   1   Meds ordered this encounter  Medications  . pravastatin (PRAVACHOL) 40 MG tablet    Sig: Take 1 tablet (40 mg total) by mouth daily.    Dispense:  90 tablet    Refill:  1     Discussed warning signs or symptoms. Please see discharge instructions. Patient expresses understanding.

## 2019-05-31 NOTE — Progress Notes (Signed)
Patient had spirometry.  See well adult visit 05/31/19 note for further info.

## 2019-05-31 NOTE — Patient Instructions (Addendum)
Thank you for coming in today. Start pravastain.  If you have muscle pain on it. Stop and let me know.  Get labs in about 6 weeks.  I will send you a reminder via mychart.   You should hear about an appointment with pulmonology soon.  Let me know if you do not hear anything.  For diet try to reduce calories by 250 calories per day.  This will result on average 0.5 pound weight loss per week.  Continue exercising and heating good quality food.   Go to radiology and schedule mammogram.   If all is well recheck yearly.   When you get your flu shot at work let us know.   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  You will still be able to see me for your Sports Medicine or Orthopedic needs in the [facility] Location. I will still be part of [place].    [place] [address], [place] [address] [address]  [phone]  Telephone (phone line will be functional starting in November).  [phone] Fax [phone] Concussion Line  If you want to stay locally for your Sports Medicine issues Dr. Dianah Field here in [place] will be happy to see you.  Additionally Dr. Clearance Coots at [facility] will be happy to see you for sports medicine issues more locally.   For your primary care needs you are welcome to establish care with Dr. Emeterio Reeve.  We are working quickly to hire more physicians to cover the primary care needs however if you cannot get an appointment with Dr. Sheppard Coil in a timely manner [facility] has locations and openings for primary care services nearby.   [facility] at [facility] [address] . [place] , [place]: [phone] . Behavioral Medicine: [phone] . Fax: [place] at [employer] [address] . [place], [place]: [phone] . Behavioral Medicine: [phone] .  Fax: [phone] . Hours (M-F): 7am - Academic librarian At [facility] [place], [place]: [phone] . Behavioral Medicine: [phone] . Fax: [phone] . Hours (M-F): 8am - Optician, dispensing at [employer] . [place], [place] Phone: [phone] . Behavioral Medicine: [phone] . Fax: [phone]

## 2019-07-11 ENCOUNTER — Other Ambulatory Visit: Payer: Self-pay | Admitting: Family Medicine

## 2019-07-11 DIAGNOSIS — K2100 Gastro-esophageal reflux disease with esophagitis, without bleeding: Secondary | ICD-10-CM

## 2019-09-14 ENCOUNTER — Encounter: Payer: Self-pay | Admitting: Pulmonary Disease

## 2019-09-14 ENCOUNTER — Ambulatory Visit (INDEPENDENT_AMBULATORY_CARE_PROVIDER_SITE_OTHER): Payer: 59 | Admitting: Pulmonary Disease

## 2019-09-14 ENCOUNTER — Other Ambulatory Visit: Payer: Self-pay

## 2019-09-14 VITALS — BP 108/76 | HR 85 | Temp 97.4°F | Ht 60.5 in | Wt 147.6 lb

## 2019-09-14 DIAGNOSIS — R053 Chronic cough: Secondary | ICD-10-CM

## 2019-09-14 DIAGNOSIS — R05 Cough: Secondary | ICD-10-CM | POA: Diagnosis not present

## 2019-09-14 MED ORDER — PANTOPRAZOLE SODIUM 40 MG PO TBEC: 40.0000 mg | DELAYED_RELEASE_TABLET | Freq: Two times a day (BID) | ORAL | 0 refills | Status: DC

## 2019-09-14 NOTE — Patient Instructions (Addendum)
Chronic cough We reviewed your spirometry test today. No evidence of asthma --Arrange for full pulmonary function test (spirometry, post-BD, lung volumes, DLCO) when next available. Can follow-up with me or NP --Discontinue inhalers --INCREASE reflux medication to twice a day for 6 weeks  Follow-up after PFTs

## 2019-09-14 NOTE — Progress Notes (Signed)
Subjective:   PATIENT ID: Julie Burton GENDER: female DOB: 12/17/1960, MRN: [id number]   HPI  Chief Complaint  Patient presents with   Consult    abnormal PFT, URI, cough for 2-3 years, GERD    Reason for Visit: New consult for abnormal PFT  Ms. Julie Burton is a 58 year old female never smoker with history of asthma, GERD s/p nissen fundoplication who presents as a referral from her PCP for abnormal PFT.  She has a chronic cough that began two or three years ago with worsening of symptoms in the winter. The cough initially begins as a dry cough that will start to become minimally productive with clear sputum. Associated with shortness of breath after coughing and with activity. Denies wheezing. 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   She has been clinically diagnosed with asthma and given a Dulera inhaler. She did not continue it as her insurance did not approve it. When she was on [facility] for a few months she was not sure if it provided any benefit. She was actively sick at the time so it is difficult for her to determine. She does feel albuterol helps. She is on PPI. Endorses caffeine intake. Has sinus headaches.   Family history is significant for COPD and dependent on oxygen since in 70s however she is a never smoker. She also has a sister with sarcoidosis and obstructive lung disease diagnosed in her 72s.  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  Past Medical History:  Diagnosis Date   Anemia    Arthritis    GERD (gastroesophageal reflux disease)    Hyperlipidemia    IBS (irritable colon syndrome) 03/31/2017   Constipation dominant   Vaginal Pap smear, abnormal    HPV     Family History  Problem Relation Age of Onset    Cancer Mother    Depression Mother    Diabetes Mother    Hyperlipidemia Mother    Hypertension Mother    COPD Mother    Heart disease Mother    Congestive Heart Failure Mother    Miscarriages / Stillbirths Paternal Grandmother    Heart disease Paternal Grandfather    Irritable bowel syndrome Father    Healthy Sister    Hypertension Brother    Sarcoidosis Sister    Asthma Sister    Hypertension Sister    Diabetes Sister    Healthy Brother      Social History   Occupational History   Not on file  Tobacco Use   Smoking status: Never Smoker   Smokeless tobacco: Never Used  Substance and Sexual Activity   Alcohol use: No    Alcohol/week: 0.0 standard drinks   Drug use: No   Sexual activity: Yes    Partners: Male    Birth control/protection: None    No Known Allergies   Outpatient Medications Prior to Visit  Medication Sig Dispense Refill   acetaminophen (TYLENOL) 650 MG CR tablet Take 2 tablets (1,300 mg total) by mouth every 8 (eight) hours as needed for pain. 90 tablet 3   albuterol (PROVENTIL HFA;VENTOLIN HFA) 108 (90 Base) MCG/ACT inhaler Inhale 2 puffs into the lungs every 6 (six) hours as needed for wheezing or shortness of breath. 1 Inhaler 0   Cholecalciferol (VITAMIN D) 2000 units  CAPS      diclofenac sodium (VOLTAREN) 1 % GEL Apply 2 g topically 4 (four) times daily. To affected joint. 100 g 11   DULoxetine (CYMBALTA) 30 MG capsule TAKE 1 CAPSULE BY MOUTH DAILY. 90 capsule 0   Ferrous Bisglycinate Chelate 15 MG TABS Take 1 tablet by mouth 3 (three) times daily. 30 tablet    Omega-3 1000 MG CAPS Take 1 capsule by mouth daily.     pantoprazole (PROTONIX) 40 MG tablet TAKE 1 TABLET BY MOUTH DAILY. 90 tablet 0   Saccharomyces boulardii (PROBIOTIC) 250 MG CAPS Take 1 capsule by mouth daily. 14 capsule    ipratropium (ATROVENT) 0.06 % nasal spray Place 2 sprays into both nostrils every 4 (four) hours as needed. (Patient not taking:  Reported on 09/14/2019) 10 mL 6   mometasone-formoterol (DULERA) 200-5 MCG/ACT AERO Inhale 2 puffs into the lungs 2 (two) times daily as needed for wheezing. (Patient not taking: Reported on 09/14/2019) 1 Inhaler 1   pravastatin (PRAVACHOL) 40 MG tablet Take 1 tablet (40 mg total) by mouth daily. 90 tablet 1   Facility-Administered Medications Prior to Visit  Medication Dose Route Frequency Provider Last Rate Last Dose   albuterol (VENTOLIN HFA) 108 (90 Base) MCG/ACT inhaler 4 puff  4 puff Inhalation Once Gregor Hams, MD        Review of Systems  Constitutional: Negative for chills, diaphoresis, fever, malaise/fatigue and weight loss.  HENT: Positive for ear pain. Negative for congestion and sore throat.        Usually right ear but both at times  Respiratory: Positive for cough and sputum production. Negative for hemoptysis, shortness of breath and wheezing.   Cardiovascular: Negative for chest pain, palpitations and leg swelling.  Gastrointestinal: Positive for heartburn. Negative for abdominal pain and nausea.  Genitourinary: Negative for frequency.  Musculoskeletal: Positive for joint pain and myalgias.  Skin: Negative for itching and rash.  Neurological: Positive for headaches. Negative for dizziness and weakness.  Endo/Heme/Allergies: Does not bruise/bleed easily.  Psychiatric/Behavioral: Negative for depression. The patient is not nervous/anxious.      Objective:   Vitals:   09/14/19 0917  BP: 108/76  Pulse: 85  Temp: (!) 97.4 F (36.3 C)  TempSrc: Temporal  SpO2: 98%  Weight: 147 lb 9.6 oz (67 kg)  Height: 5' 0.5" (1.537 m)   SpO2: 98 % O2 Device: None (Room air)  Physical Exam: General: Well-appearing, no acute distress HENT: [place], AT Eyes: EOMI, no scleral icterus Respiratory: Clear to auscultation bilaterally.  No crackles, wheezing or rales Cardiovascular: RRR, -M/R/G, no JVD GI: BS+, soft, nontender Extremities:-Edema,-tenderness Neuro: AAO x4, CNII-XII  grossly intact Skin: Intact, no rashes or bruising Psych: Normal mood, normal affect  Data Reviewed:  Imaging: CXR 12/15/18 - No infiltrate, edema or effusion  PFT: 05/31/2019 Spirometry Pre--BD: FVC 2.64 (87%) FEV1 2.14 (90%) Ratio 81.  No significant bronchodilator effect.  Peak expiratory flow 54% with improvement of 17% postbronchodilator.  This value is effort dependent and does not indicate significant change however may suggest clinical benefit with bronchodilators.   Interpretation: Normal spirometry.  No significant bronchodilator response however patient may still benefit from bronchodilator use.  Recommend clinical correlation  Labs: CBC    Component Value Date/Time   WBC 8.0 05/10/2018 1510   RBC 4.41 05/10/2018 1510   HGB 13.6 05/10/2018 1510   HCT 40.8 05/10/2018 1510   PLT 315 05/10/2018 1510   MCV 92.5 05/10/2018 1510   MCH 30.8 05/10/2018  1510   MCHC 33.3 05/10/2018 1510   RDW 13.3 05/10/2018 1510   BMET    Component Value Date/Time   NA 139 05/10/2018 1510   K 4.0 05/10/2018 1510   CL 106 05/10/2018 1510   CO2 26 05/10/2018 1510   GLUCOSE 90 05/10/2018 1510   BUN 9 05/10/2018 1510   CREATININE 0.79 05/10/2018 1510   CALCIUM 9.7 05/10/2018 1510   GFRNONAA 72 12/14/2017 0854   GFRAA 83 12/14/2017 0854   Imaging, labs and tests noted above have been reviewed independently by me.    Assessment & Plan:   Discussion: 58 year old female never smoker with GERD s/p nissen fundoplication who presents who chronic cough. We reviewed her spirometry which does not demonstrate any findings of obstructive or reversible airway disease. She does for a 17% in post-bronchodilator PEF but this value is effort dependent so significance is unclear. Can consider continuing bronchodilators if patient reports clinical benefit. After discussion, patient preferred to stop inhalers due to cost and ineffectiveness. With her family history, we will obtain PFTs rule out early  restrictive lung disease. She may warrant yearly PFTs if she has ongoing symptoms and early detection of lung abnormalities.  If she wishes to restart inhalers in the future, our pharmacy sent a test claim for the following bronchodilators. Advair Diskus - # 1 inhaler for a 1 month supply through patient's insurance is $ 5.00. Plan prefers brand.  Advair HFA - # 1 inhaler for a 1 month supply through patient's insurance is $ 73.42.  Breo Ellipta - # 1 inhaler for a 1 month supply through patient's insurance is $ 67.46.  Symbicort - # 1 inhaler for a 1 month supply through patient's insurance is $ 67.94. Plan prefers brand.  [facility]- Prior Authorization is required, plan requires patient to have tried one of the plan formulary options within previous 120 days.   Chronic cough We reviewed your spirometry test today. No evidence of asthma --Arrange for full pulmonary function test (spirometry, post-BD, lung volumes, DLCO) when next available. Can follow-up with me or NP --Discontinue inhalers --INCREASE reflux medication to twice a day for 6 weeks  Health Maintenance Immunization History  Administered Date(s) Administered   Influenza Whole 07/11/2019   Influenza,inj,Quad PF,6+ Mos 07/28/2018   Influenza-Unspecified 07/02/2015, 07/07/2016, 07/24/2017   Tdap 01/12/2012   No orders of the defined types were placed in this encounter.  Meds ordered this encounter  Medications   pantoprazole (PROTONIX) 40 MG tablet    Sig: Take 1 tablet (40 mg total) by mouth 2 (two) times daily.    Dispense:  84 tablet    Refill:  0    Take bid for 6 weeks then back to 40mg  daily   Return for after PFTs.  [place], MD [facility] 09/14/2019 9:28 AM  Office Number [phone]

## 2019-09-15 DIAGNOSIS — R05 Cough: Secondary | ICD-10-CM | POA: Insufficient documentation

## 2019-09-15 DIAGNOSIS — R053 Chronic cough: Secondary | ICD-10-CM | POA: Insufficient documentation

## 2019-10-31 ENCOUNTER — Other Ambulatory Visit: Payer: Self-pay | Admitting: Family Medicine

## 2019-10-31 MED FILL — PANTOPRAZOLE SOD DR 40 MG T: 40 | 42 days supply | Qty: 84 | Fill #0

## 2019-11-03 MED FILL — DULoxetine HCL 30 MG CPEP: 30 | 90 days supply | Qty: 90 | Fill #0

## 2019-11-04 ENCOUNTER — Inpatient Hospital Stay (HOSPITAL_COMMUNITY): Admission: RE | Admit: 2019-11-04 | Payer: 59 | Source: Ambulatory Visit

## 2019-11-09 ENCOUNTER — Ambulatory Visit: Payer: 59 | Admitting: Primary Care

## 2019-11-24 IMAGING — DX DG CHEST 2V
2 series · 2 of 2 positions shown · non-contrast
Comparison: None in PACs

CLINICAL DATA: Three months of cough, nonsmoker.

EXAM:
CHEST  2 VIEW

[chest pa]
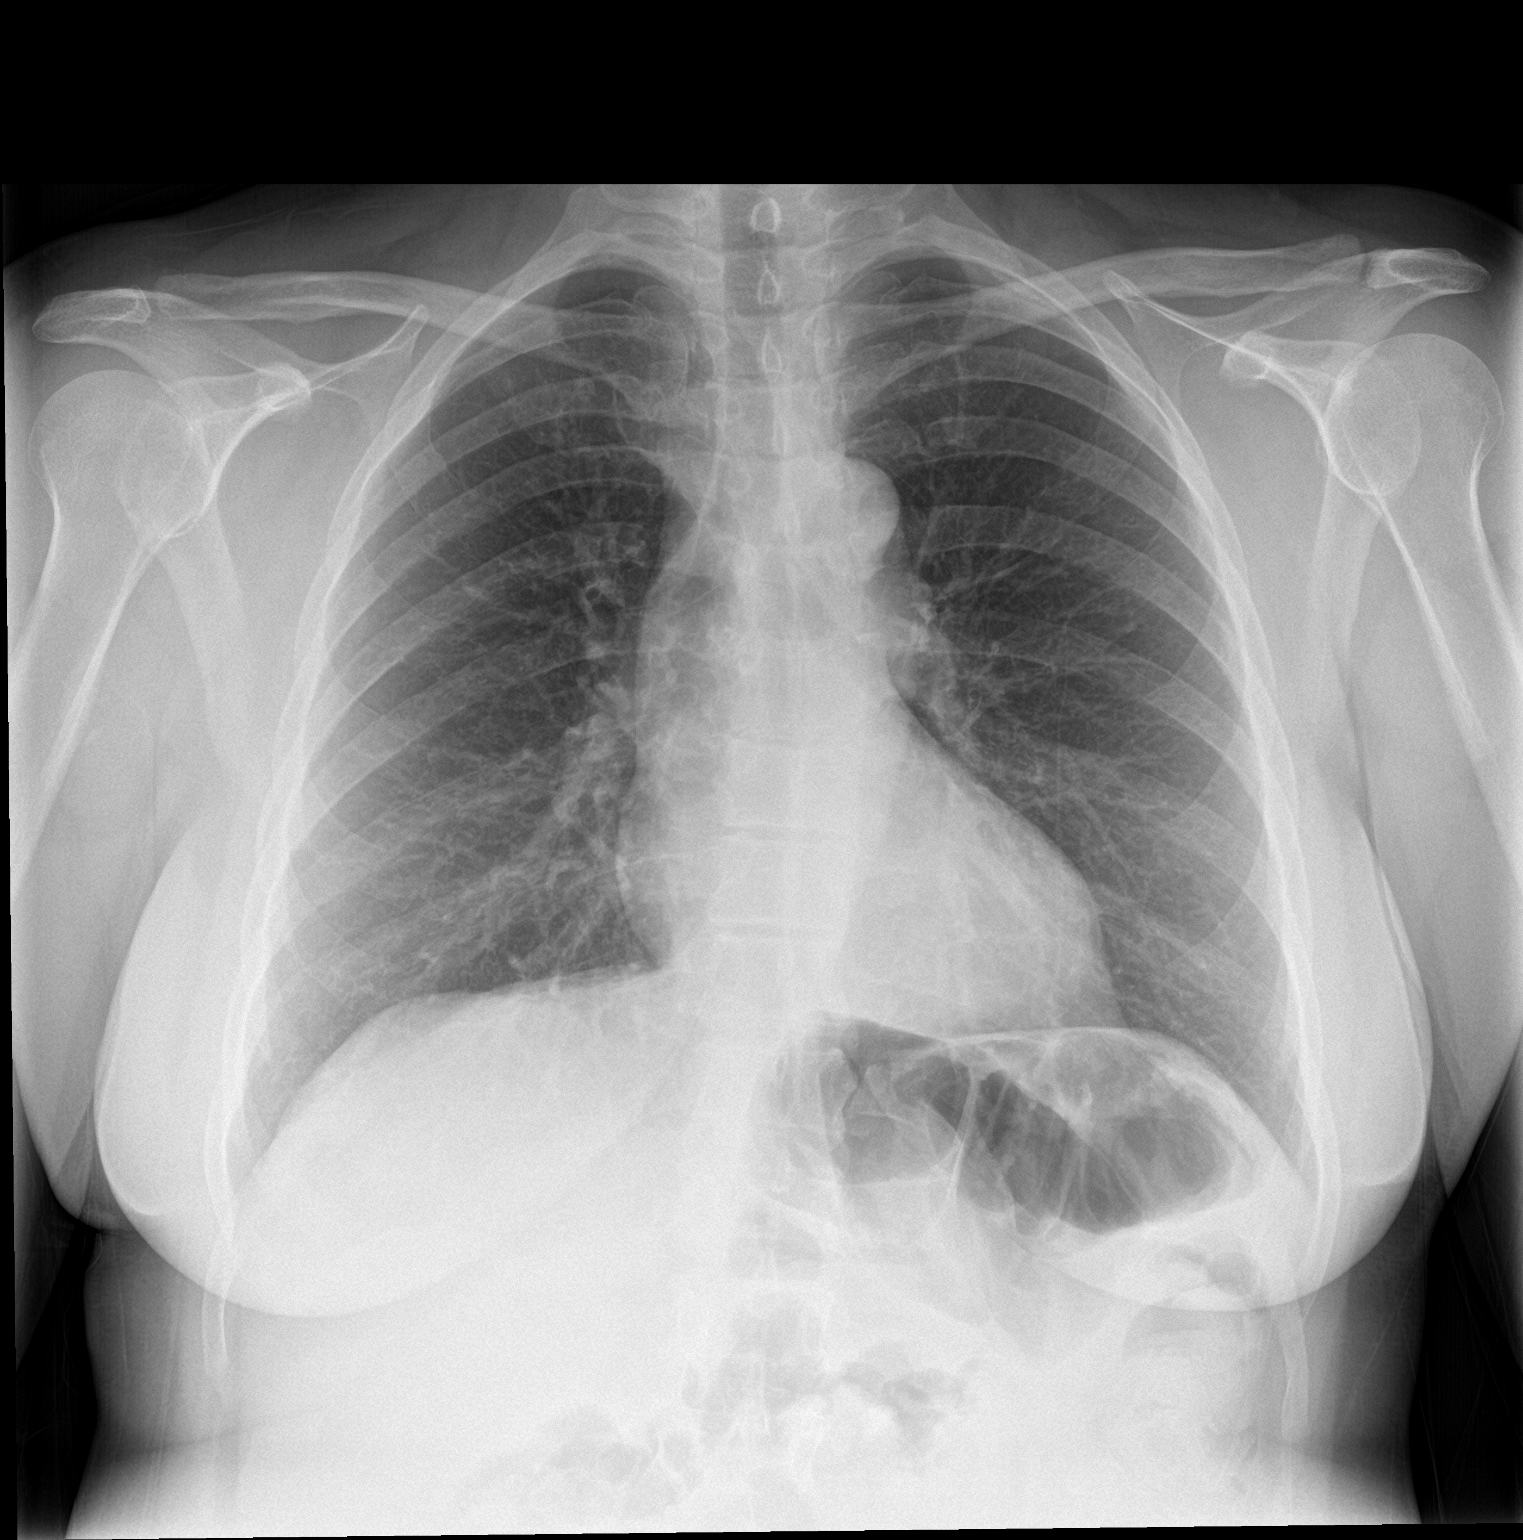

[chest lat]
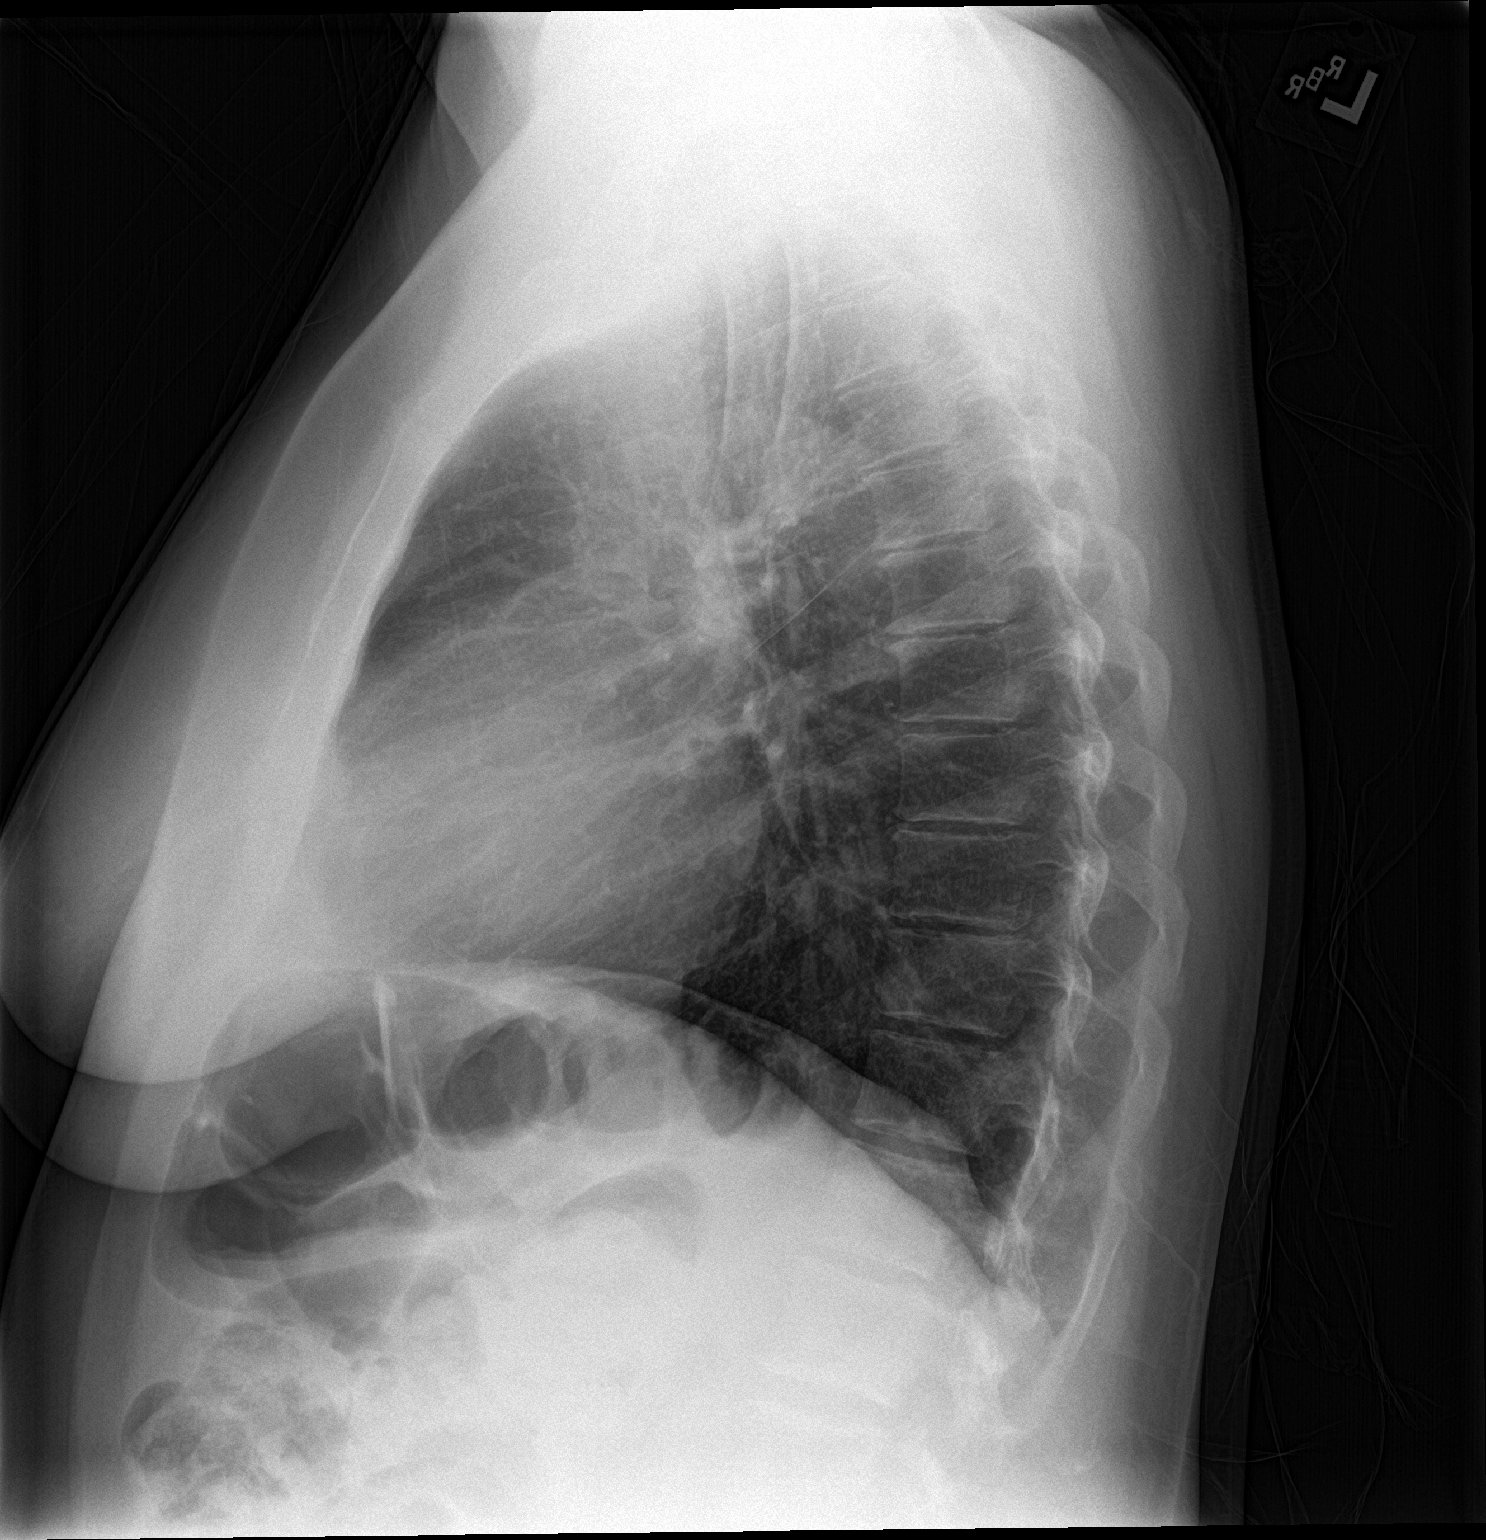

[2 of 2 positions shown; findings below may reference images not displayed]

FINDINGS: The lungs are adequately inflated and clear. The heart and pulmonary
vascularity are normal. The mediastinum is normal in width. There is
no pleural effusion. The trachea is midline. The bony thorax
exhibits no acute abnormality.
IMPRESSION: There is no active cardiopulmonary disease.

## 2019-11-30 ENCOUNTER — Encounter: Payer: 59 | Admitting: Family Medicine

## 2020-02-09 ENCOUNTER — Other Ambulatory Visit: Payer: Self-pay | Admitting: Family Medicine

## 2020-02-09 ENCOUNTER — Other Ambulatory Visit: Payer: Self-pay | Admitting: Pulmonary Disease

## 2020-02-09 MED FILL — DULoxetine HCL 30 MG CPEP: 30 | 30 days supply | Qty: 30 | Fill #0

## 2020-02-20 ENCOUNTER — Encounter: Payer: Self-pay | Admitting: Family Medicine

## 2020-02-20 ENCOUNTER — Other Ambulatory Visit: Payer: Self-pay

## 2020-02-20 ENCOUNTER — Ambulatory Visit (INDEPENDENT_AMBULATORY_CARE_PROVIDER_SITE_OTHER): Payer: 59 | Admitting: Family Medicine

## 2020-02-20 VITALS — BP 146/76 | HR 76 | Ht 60.63 in | Wt 149.5 lb

## 2020-02-20 DIAGNOSIS — K2100 Gastro-esophageal reflux disease with esophagitis, without bleeding: Secondary | ICD-10-CM

## 2020-02-20 DIAGNOSIS — M545 Low back pain, unspecified: Secondary | ICD-10-CM

## 2020-02-20 DIAGNOSIS — R7303 Prediabetes: Secondary | ICD-10-CM

## 2020-02-20 DIAGNOSIS — G8929 Other chronic pain: Secondary | ICD-10-CM

## 2020-02-20 DIAGNOSIS — E785 Hyperlipidemia, unspecified: Secondary | ICD-10-CM

## 2020-02-20 DIAGNOSIS — M79645 Pain in left finger(s): Secondary | ICD-10-CM

## 2020-02-20 DIAGNOSIS — K219 Gastro-esophageal reflux disease without esophagitis: Secondary | ICD-10-CM

## 2020-02-20 DIAGNOSIS — D500 Iron deficiency anemia secondary to blood loss (chronic): Secondary | ICD-10-CM | POA: Diagnosis not present

## 2020-02-20 DIAGNOSIS — M79644 Pain in right finger(s): Secondary | ICD-10-CM | POA: Diagnosis not present

## 2020-02-20 MED ORDER — DULOXETINE HCL 30 MG PO CPEP: 30.0000 mg | ORAL_CAPSULE | Freq: Every day | ORAL | 2 refills | Status: AC

## 2020-02-20 MED ORDER — PANTOPRAZOLE SODIUM 40 MG PO TBEC: 40.0000 mg | DELAYED_RELEASE_TABLET | Freq: Every day | ORAL | 2 refills | Status: AC

## 2020-02-20 NOTE — Progress Notes (Signed)
Julie Burton says she took the 1st vaccine injection in December. It made her "really sick" and she missed work therefore she did not continue. Plans to restart later in the summer or fall

## 2020-02-20 NOTE — Assessment & Plan Note (Signed)
She continues on cymbalta for management of this, stable at this time.

## 2020-02-20 NOTE — Assessment & Plan Note (Signed)
No medication at this time.  Update lipid panel.

## 2020-02-20 NOTE — Assessment & Plan Note (Signed)
Recommend scheduling appt with Dr. Darene Lamer to discuss injection.

## 2020-02-20 NOTE — Patient Instructions (Signed)
Great to meet you today! Have labs completed.  Continue current medications. Schedule with Dr. Darene Lamer for thumb pain/stiffness See me again in about 5-6 months.

## 2020-02-20 NOTE — Assessment & Plan Note (Signed)
Update blood sugar/a1c today.

## 2020-02-20 NOTE — Progress Notes (Signed)
Julie Burton - 59 y.o. female MRN [id number]  Date of birth: 06/07/1961  Subjective Chief Complaint  Patient presents with  . Establish Care    HPI Julie Burton is a 59 y.o. female with history of prediabetes, HLD, IBS and GERD here today for follow up visit.  She is having some pain in her thumbs with stiffness.  She has injection by Dr. Georgina Snell previously which was helpful and would like to have this again.   Her GERD is managed well with pantoprazole.  Denies abdominal pain or nausea.   She is taking duloxetine for chronic back pain, she thinks it is helpful.   She is due for updated labs.    ROS:  A comprehensive ROS was completed and negative except as noted per HPI  No Known Allergies  Past Medical History:  Diagnosis Date  . Anemia   . Arthritis   . GERD (gastroesophageal reflux disease)   . Hyperlipidemia   . IBS (irritable colon syndrome) 03/31/2017   Constipation dominant  . Vaginal Pap smear, abnormal    HPV    Past Surgical History:  Procedure Laterality Date  . BUNIONECTOMY  2008  . CESAREAN SECTION    . COLPOSCOPY    . NISSEN FUNDOPLICATION    . TONSILLECTOMY    . TUBAL LIGATION      Social History   Socioeconomic History  . Marital status: Married    Spouse name: Not on file  . Number of children: Not on file  . Years of education: Not on file  . Highest education level: Not on file  Occupational History  . Not on file  Tobacco Use  . Smoking status: Never Smoker  . Smokeless tobacco: Never Used  Substance and Sexual Activity  . Alcohol use: No    Alcohol/week: 0.0 standard drinks  . Drug use: No  . Sexual activity: Yes    Partners: Male    Birth control/protection: None  Other Topics Concern  . Not on file  Social History Narrative  . Not on file   Social Determinants of Health   Financial Resource Strain:   . Difficulty of Paying Living Expenses:   Food Insecurity:   . Worried About Charity fundraiser in the Last Year:   . Arts development officer in the Last Year:   Transportation Needs:   . Film/video editor (Medical):   Marland Kitchen Lack of Transportation (Non-Medical):   Physical Activity:   . Days of Exercise per Week:   . Minutes of Exercise per Session:   Stress:   . Feeling of Stress :   Social Connections:   . Frequency of Communication with Friends and Family:   . Frequency of Social Gatherings with Friends and Family:   . Attends Religious Services:   . Active Member of Clubs or Organizations:   . Attends Archivist Meetings:   Marland Kitchen Marital Status:     Family History  Problem Relation Age of Onset  . Cancer Mother   . Depression Mother   . Diabetes Mother   . Hyperlipidemia Mother   . Hypertension Mother   . COPD Mother   . Heart disease Mother   . Congestive Heart Failure Mother   . Miscarriages / Stillbirths Paternal Grandmother   . Heart disease Paternal Grandfather   . Irritable bowel syndrome Father   . Healthy Sister   . Hypertension Brother   . Sarcoidosis Sister   . Asthma Sister   .  Hypertension Sister   . Diabetes Sister   . Healthy Brother     Health Maintenance  Topic Date Due  . MAMMOGRAM  04/21/2020  . INFLUENZA VACCINE  05/13/2020  . PAP SMEAR-Modifier  05/10/2021  . TETANUS/TDAP  01/11/2022  . COLONOSCOPY  07/31/2022  . Hepatitis C Screening  Completed  . HIV Screening  Completed     ----------------------------------------------------------------------------------------------------------------------------------------------------------------------------------------------------------------- Physical Exam BP (!) 146/76 (BP Location: Left Arm, Patient Position: Sitting, Cuff Size: Normal)   Pulse 76   Ht 5' 0.63" (1.54 m)   Wt 149 lb 8 oz (67.8 kg)   SpO2 98%   BMI 28.59 kg/m   Physical Exam Constitutional:      Appearance: Normal appearance.  HENT:     Head: Normocephalic and atraumatic.  Eyes:     General: No scleral icterus. Cardiovascular:     Rate  and Rhythm: Normal rate and regular rhythm.  Pulmonary:     Effort: Pulmonary effort is normal.     Breath sounds: Normal breath sounds.  Skin:    General: Skin is warm and dry.  Neurological:     General: No focal deficit present.     Mental Status: She is alert.  Psychiatric:        Mood and Affect: Mood normal.        Behavior: Behavior normal.     ------------------------------------------------------------------------------------------------------------------------------------------------------------------------------------------------------------------- Assessment and Plan  Prediabetes Update blood sugar/a1c today.   Lumbago She continues on cymbalta for management of this, stable at this time.   HLD (hyperlipidemia) No medication at this time.  Update lipid panel.   Anemia, iron deficiency Update CBC  Bilateral thumb pain Recommend scheduling appt with Dr. Darene Lamer to discuss injection.   GERD (gastroesophageal reflux disease) Well controlled with protonix, continue.    Meds ordered this encounter  Medications  . DULoxetine (CYMBALTA) 30 MG capsule    Sig: Take 1 capsule (30 mg total) by mouth daily.    Dispense:  90 capsule    Refill:  2  . pantoprazole (PROTONIX) 40 MG tablet    Sig: Take 1 tablet (40 mg total) by mouth daily.    Dispense:  90 tablet    Refill:  2    Return in about 6 months (around 08/22/2020) for prediabetes/hld.    This visit occurred during the SARS-CoV-2 public health emergency.  Safety protocols were in place, including screening questions prior to the visit, additional usage of staff PPE, and extensive cleaning of exam room while observing appropriate contact time as indicated for disinfecting solutions.

## 2020-02-20 NOTE — Assessment & Plan Note (Signed)
Well controlled with protonix, continue.

## 2020-02-20 NOTE — Assessment & Plan Note (Signed)
Update CBC. 

## 2020-02-21 LAB — COMPLETE METABOLIC PANEL WITH GFR
AG Ratio: 1.6 (calc) (ref 1.0–2.5)
ALT: 14 U/L (ref 6–29)
AST: 17 U/L (ref 10–35)
Albumin: 4.2 g/dL (ref 3.6–5.1)
Alkaline phosphatase (APISO): 69 U/L (ref 37–153)
BUN: 9 mg/dL (ref 7–25)
CO2: 27 mmol/L (ref 20–32)
Calcium: 9.6 mg/dL (ref 8.6–10.4)
Chloride: 106 mmol/L (ref 98–110)
Creat: 0.81 mg/dL (ref 0.50–1.05)
GFR, Est African American: 94 mL/min/{1.73_m2} (ref >=60)
GFR, Est Non African American: 81 mL/min/{1.73_m2} (ref >=60)
Globulin: 2.6 g/dL (calc) (ref 1.9–3.7)
Glucose, Bld: 96 mg/dL (ref 65–99)
Potassium: 4 mmol/L (ref 3.5–5.3)
Sodium: 139 mmol/L (ref 135–146)
Total Bilirubin: 0.4 mg/dL (ref 0.2–1.2)
Total Protein: 6.8 g/dL (ref 6.1–8.1)

## 2020-02-21 LAB — CBC
HCT: 37.9 % (ref 35.0–45.0)
Hemoglobin: 12.8 g/dL (ref 11.7–15.5)
MCH: 30.4 pg (ref 27.0–33.0)
MCHC: 33.8 g/dL (ref 32.0–36.0)
MCV: 90 fL (ref 80.0–100.0)
MPV: 10.4 fL (ref 7.5–12.5)
Platelets: 327 10*3/uL (ref 140–400)
RBC: 4.21 10*6/uL (ref 3.80–5.10)
RDW: 13.2 % (ref 11.0–15.0)
WBC: 4.3 10*3/uL (ref 3.8–10.8)

## 2020-02-21 LAB — LIPID PANEL
Cholesterol: 256 mg/dL — ABNORMAL HIGH (ref <200)
HDL: 60 mg/dL (ref >=50)
LDL Cholesterol (Calc): 168 mg/dL (calc) — ABNORMAL HIGH
Non-HDL Cholesterol (Calc): 196 mg/dL (calc) — ABNORMAL HIGH (ref <130)
Total CHOL/HDL Ratio: 4.3 (calc) (ref <5.0)
Triglycerides: 139 mg/dL (ref <150)

## 2020-02-21 LAB — HEMOGLOBIN A1C
Hgb A1c MFr Bld: 5.3 % of total Hgb (ref <5.7)
Mean Plasma Glucose: 105 (calc)
eAG (mmol/L): 5.8 (calc)

## 2020-02-28 ENCOUNTER — Other Ambulatory Visit: Payer: Self-pay | Admitting: Family Medicine

## 2020-02-28 MED ORDER — ROSUVASTATIN CALCIUM 10 MG PO TABS
10.0000 mg | ORAL_TABLET | Freq: Every day | ORAL | 3 refills | Status: AC
Start: 2020-02-28 — End: ?

## 2020-02-28 MED FILL — ROSUVASTATIN CALCIUM 10 MG: 10 | 90 days supply | Qty: 90 | Fill #0

## 2020-03-09 MED FILL — ROSUVASTATIN CALCIUM 10 MG: 10 | 90 days supply | Qty: 90 | Fill #0

## 2020-03-13 MED FILL — DULoxetine HCL 30 MG CPEP: 30 | 90 days supply | Qty: 90 | Fill #0

## 2020-06-13 MED FILL — ROSUVASTATIN CALCIUM 10 MG: 10 | 90 days supply | Qty: 90 | Fill #1

## 2020-06-13 MED FILL — PANTOPRAZOLE SOD DR 40 MG T: 40 | 90 days supply | Qty: 90 | Fill #1

## 2020-06-13 MED FILL — DULoxetine HCL 30 MG CPEP: 30 | 90 days supply | Qty: 90 | Fill #1

## 2020-08-22 ENCOUNTER — Ambulatory Visit: Payer: 59 | Admitting: Family Medicine
